# Patient Record
Sex: Male | Born: 1945 | Race: Black or African American | Hispanic: No | Marital: Married | Smoking: Former smoker
Health system: Southern US, Community
[De-identification: ages and names within clinical notes are randomized; demographics above are authoritative.]

## PROBLEM LIST (undated history)

## (undated) DIAGNOSIS — J189 Pneumonia, unspecified organism: Secondary | ICD-10-CM

## (undated) DIAGNOSIS — N179 Acute kidney failure, unspecified: Secondary | ICD-10-CM

## (undated) DIAGNOSIS — M1990 Unspecified osteoarthritis, unspecified site: Secondary | ICD-10-CM

## (undated) DIAGNOSIS — D494 Neoplasm of unspecified behavior of bladder: Secondary | ICD-10-CM

## (undated) DIAGNOSIS — N390 Urinary tract infection, site not specified: Secondary | ICD-10-CM

## (undated) DIAGNOSIS — C801 Malignant (primary) neoplasm, unspecified: Secondary | ICD-10-CM

## (undated) DIAGNOSIS — I739 Peripheral vascular disease, unspecified: Secondary | ICD-10-CM

## (undated) HISTORY — PX: TONSILLECTOMY: SUR1361

## (undated) HISTORY — PX: HERNIA REPAIR: SHX51

## (undated) HISTORY — DX: Malignant (primary) neoplasm, unspecified: C80.1

## (undated) HISTORY — PX: COLON SURGERY: SHX602

---

## 1998-03-26 ENCOUNTER — Ambulatory Visit (HOSPITAL_COMMUNITY): Admission: RE | Admit: 1998-03-26 | Discharge: 1998-03-26 | Payer: Self-pay | Admitting: Internal Medicine

## 2003-01-28 ENCOUNTER — Ambulatory Visit (HOSPITAL_COMMUNITY): Admission: RE | Admit: 2003-01-28 | Discharge: 2003-01-28 | Payer: Self-pay | Admitting: Gastroenterology

## 2005-11-22 ENCOUNTER — Encounter: Payer: Self-pay | Admitting: Internal Medicine

## 2016-02-10 ENCOUNTER — Ambulatory Visit
Admission: RE | Admit: 2016-02-10 | Discharge: 2016-02-10 | Disposition: A | Discharge status: Home / Self Care | Payer: Medicare Other | Source: Ambulatory Visit | Attending: Internal Medicine | Admitting: Internal Medicine

## 2016-02-10 ENCOUNTER — Other Ambulatory Visit: Payer: Self-pay | Admitting: Internal Medicine

## 2016-02-10 DIAGNOSIS — R0602 Shortness of breath: Secondary | ICD-10-CM

## 2017-07-25 ENCOUNTER — Other Ambulatory Visit: Payer: Self-pay | Admitting: Internal Medicine

## 2017-07-25 DIAGNOSIS — Z136 Encounter for screening for cardiovascular disorders: Secondary | ICD-10-CM

## 2017-07-27 ENCOUNTER — Encounter: Payer: Self-pay | Admitting: Vascular Surgery

## 2017-07-27 ENCOUNTER — Ambulatory Visit (INDEPENDENT_AMBULATORY_CARE_PROVIDER_SITE_OTHER): Payer: Medicare Other | Admitting: Vascular Surgery

## 2017-07-27 VITALS — BP 143/75 | HR 73 | Temp 97.7°F | Resp 16 | Ht 69.5 in | Wt 261.0 lb

## 2017-07-27 DIAGNOSIS — I713 Abdominal aortic aneurysm, ruptured, unspecified: Secondary | ICD-10-CM

## 2017-07-27 DIAGNOSIS — I714 Abdominal aortic aneurysm, without rupture, unspecified: Secondary | ICD-10-CM | POA: Insufficient documentation

## 2017-07-27 DIAGNOSIS — I723 Aneurysm of iliac artery: Secondary | ICD-10-CM | POA: Diagnosis not present

## 2017-07-27 NOTE — Addendum Note (Signed)
Addended by: Lianne Cure A on: 07/27/2017 01:13 PM   Modules accepted: Orders

## 2017-07-27 NOTE — Progress Notes (Signed)
Requested by: Dr. Rosie Fate  Reason for consultation: AAA   History of Present Illness   The patient is a 72 y.o. (1946/04/16) male who presents with chief complaint: blood in urine.  Patient had CT scan which demonstrated a anterior bladder tumor and AAA 5.4 cm x 5.0 cm.  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  The patient does not have back or abdominal pain.  The patient notes no history of embolic episodes from the AAA.  The patient's risk factors for AAA included: age, male sex.  The patient does smoke cigarettes.  Past Medical History Colon Mass Bladder Mass  Past Surgical History: colon surgery  Social History   Socioeconomic History  . Marital status: Married    Spouse name: Not on file  . Number of children: Not on file  . Years of education: Not on file  . Highest education level: Not on file  Social Needs  . Financial resource strain: Not on file  . Food insecurity - worry: Not on file  . Food insecurity - inability: Not on file  . Transportation needs - medical: Not on file  . Transportation needs - non-medical: Not on file  Occupational History  . Not on file  Tobacco Use  . Smoking status: Current Every Day Smoker  . Smokeless tobacco: Never Used  . Tobacco comment: 11-15 cigarettes per day  Substance and Sexual Activity  . Alcohol use: Not on file  . Drug use: Not on file  . Sexual activity: Not on file  Other Topics Concern  . Not on file  Social History Narrative  . Not on file   Family History: prostate cancer  Current Outpatient Medications  Medication Sig Dispense Refill  . naproxen sodium (ALEVE) 220 MG tablet Take 220 mg by mouth.     No current facility-administered medications for this visit.      No Known Allergies  REVIEW OF SYSTEMS (negative unless checked):   Cardiac:  []  Chest pain or chest pressure? []  Shortness of breath upon activity? []  Shortness of breath when lying  flat? []  Irregular heart rhythm?  Vascular:  []  Pain in calf, thigh, or hip brought on by walking? []  Pain in feet at night that wakes you up from your sleep? []  Blood clot in your veins? []  Leg swelling?  Pulmonary:  []  Oxygen at home? []  Productive cough? []  Wheezing?  Neurologic:  []  Sudden weakness in arms or legs? []  Sudden numbness in arms or legs? []  Sudden onset of difficult speaking or slurred speech? []  Temporary loss of vision in one eye? []  Problems with dizziness?  Gastrointestinal:  [x]  Blood in stool? []  Vomited blood?  Genitourinary:  []  Burning when urinating? []  Blood in urine?  Psychiatric:  []  Major depression  Hematologic:  []  Bleeding problems? []  Problems with blood clotting?  Dermatologic:  []  Rashes or ulcers?  Constitutional:  []  Fever or chills?  Ear/Nose/Throat:  []  Change in hearing? []  Nose bleeds? []  Sore throat?  Musculoskeletal:  []  Back pain? []  Joint pain? []  Muscle pain?   For VQI Use Only   PRE-ADM LIVING Home  AMB STATUS Ambulatory  CAD Sx None  PRIOR CHF None  STRESS TEST No   Physical Examination     Vitals:   07/27/17 1005  BP: (!) 143/75  Pulse: 73  Resp: 16  Temp: 97.7 F (36.5 C)  TempSrc: Oral  SpO2: 96%  Weight: 261 lb (118.4 kg)  Height: 5' 9.5" (1.765 m)   Body mass index is 37.99 kg/m.  General Alert, O x 3, WD, NAD  Head [place]/AT,    Ear/Nose/ Throat Hearing grossly intact, nares without erythema or drainage, oropharynx without Erythema or Exudate, Mallampati score: 3,   Eyes PERRLA, EOMI,    Neck Supple, mid-line trachea,    Pulmonary Sym exp, good B air movt, CTA B  Cardiac RRR, Nl S1, S2, no Murmurs, No rubs, No S3,S4  Vascular Vessel Right Left  Radial Palpable Palpable  Brachial Palpable Palpable  Carotid Palpable, No Bruit Palpable, No Bruit  Aorta Not palpable N/A  Femoral Palpable Palpable  Popliteal Not palpable Not palpable  PT Not palpable Not palpable  DP Faintly  palpable Faintly palpable    Gastro- intestinal soft, non-distended, non-tender to palpation, No guarding or rebound, no HSM, no masses, no CVAT B, No palpable prominent aortic pulse,    Musculo- skeletal M/S 5/5 throughout  , Extremities without ischemic changes  , No edema present, No visible varicosities , No Lipodermatosclerosis present  Neurologic Cranial nerves 2-12 intact , Pain and light touch intact in extremities , Motor exam as listed above  Psychiatric Judgement intact, Mood & affect appropriate for pt's clinical situation  Dermatologic See M/S exam for extremity exam, No rashes otherwise noted  Lymphatic  Palpable lymph nodes: None    Radiology     CT abd.pelvis w/ and w/o contrast (07/12/17):   1.9 cm x 2.8 cm enhancing lesion in anterior bladder  Incidental 5.4 cm x 5.0 cm abdominal aortic aneurysm  R CIA aneurysm, 4.4 cm  L CIA aneurysm, 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   Medical Decision Making   Jae Skeet is a 72 y.o. 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  Pt will need CTA abd/pelvis to determine whether EVAR +/- iliac branch device placement is possible.  Ideally would like to avoid open repair given history of prior colon surgery and the anticipated need for bladder surgery.  Will get the CTA this coming week and contact the pt with findings.  If EVAR is possible, will arrange for such otherwise will get preop Cardiac clearance and discuss with Urology whether a staged or combined approach to addressing both issues.  Thank you for allowing [place] to participate in this patient's care.   Adele Barthel, MD, FACS [facility] Office: [phone] Pager: [phone]  07/27/2017, 10:25 AM

## 2017-07-31 ENCOUNTER — Ambulatory Visit: Payer: Medicare Other

## 2017-08-20 NOTE — Progress Notes (Signed)
Established Abdominal Aortic Aneurysm   History of Present Illness   The patient is a 72 y.o. (12/24/1945) male with bladder cancer who presents with chief complaint: follow up for AAA.  Previous studies demonstrate an AAA, measuring 5.4 cm x 5.0 cm.  Additionally, R CIA 4.4 cm and L CIA 5.8 cm.  The patient does not have back or abdominal pain.  The patient is an active smoker.   Past Medical History Colon Mass Bladder Mass  Past Surgical History: colon surgery   Social History   Socioeconomic History  . Marital status: Married    Spouse name: Not on file  . Number of children: Not on file  . Years of education: Not on file  . Highest education level: Not on file  Social Needs  . Financial resource strain: Not on file  . Food insecurity - worry: Not on file  . Food insecurity - inability: Not on file  . Transportation needs - medical: Not on file  . Transportation needs - non-medical: Not on file  Occupational History  . Not on file  Tobacco Use  . Smoking status: Current Every Day Smoker  . Smokeless tobacco: Never Used  . Tobacco comment: 11-15 cigarettes per day  Substance and Sexual Activity  . Alcohol use: Not on file  . Drug use: Not on file  . Sexual activity: Not on file  Other Topics Concern  . Not on file  Social History Narrative  . Not on file    Family History: patient is unable to detail the medical history of his parents   Current Outpatient Medications  Medication Sig Dispense Refill  . naproxen sodium (ALEVE) 220 MG tablet Take 220 mg by mouth.     No current facility-administered medications for this visit.      No Known Allergies  REVIEW OF SYSTEMS (negative unless checked):   Cardiac:  []  Chest pain or chest pressure? []  Shortness of breath upon activity? []  Shortness of breath when lying flat? []  Irregular heart rhythm?  Vascular:  []  Pain in calf, thigh, or hip brought on by walking? []  Pain in feet at night that wakes  you up from your sleep? []  Blood clot in your veins? []  Leg swelling?  Pulmonary:  []  Oxygen at home? []  Productive cough? []  Wheezing?  Neurologic:  []  Sudden weakness in arms or legs? []  Sudden numbness in arms or legs? []  Sudden onset of difficult speaking or slurred speech? []  Temporary loss of vision in one eye? []  Problems with dizziness?  Gastrointestinal:  []  Blood in stool? []  Vomited blood?  Genitourinary:  []  Burning when urinating? [x]  Blood in urine?  Psychiatric:  []  Major depression  Hematologic:  []  Bleeding problems? []  Problems with blood clotting?  Dermatologic:  []  Rashes or ulcers?  Constitutional:  []  Fever or chills?  Ear/Nose/Throat:  []  Change in hearing? []  Nose bleeds? []  Sore throat?  Musculoskeletal:  []  Back pain? []  Joint pain? []  Muscle pain?   Physical Examination   Vitals:   08/24/17 0921  BP: (!) 141/90  Pulse: 60  Resp: 16  Temp: 97.6 F (36.4 C)  TempSrc: Oral  SpO2: 96%  Weight: 258 lb (117 kg)  Height: 5' 9.5" (1.765 m)   Body mass index is 37.55 kg/m.  General Alert, O x 3, WD, NAD  Head [place]/AT,    Ear/Nose/ Throat Hearing grossly intact, nares without erythema or drainage, oropharynx without Erythema or Exudate, Mallampati score: 3,  Eyes PERRLA, EOMI,    Neck Supple, mid-line trachea,    Pulmonary Sym exp, good B air movt, CTA B  Cardiac RRR, Nl S1, S2, no Murmurs, No rubs, No S3,S4  Vascular Vessel Right Left  Radial Palpable Palpable  Brachial Palpable Palpable  Carotid Palpable, No Bruit Palpable, No Bruit  Aorta Not palpable N/A  Femoral Palpable Palpable  Popliteal Not palpable Not palpable  PT Not palpable Not palpable  DP Faintly palpable Faintly palpable    Gastro- intestinal soft, non-distended, non-tender to palpation, No guarding or rebound, no HSM, no masses, no CVAT B, No palpable prominent aortic pulse,    Musculo- skeletal M/S 5/5 throughout  , Extremities without ischemic  changes  , No edema present, No visible varicosities , No Lipodermatosclerosis present  Neurologic Cranial nerves 2-12 intact , Pain and light touch intact in extremities , Motor exam as listed above  Psychiatric Judgement intact, Mood & affect appropriate for pt's clinical situation  Dermatologic See M/S exam for extremity exam, No rashes otherwise noted  Lymphatic  Palpable lymph nodes: None    Radiology     Ct Angio Abdomen Pelvis  W &/or Wo Contrast  Result Date: 08/24/2017 CLINICAL DATA:  Abdominal aortic aneurysm EXAM: CTA ABDOMEN AND PELVIS wITHOUT AND WITH CONTRAST TECHNIQUE: Multidetector CT imaging of the abdomen and pelvis was performed using the standard protocol during bolus administration of intravenous contrast. Multiplanar reconstructed images and MIPs were obtained and reviewed to evaluate the vascular anatomy. CONTRAST:  158mL ISOVUE-370 IOPAMIDOL (ISOVUE-370) INJECTION 76% Creatinine was obtained on site at [facility] at [address] Results: Creatinine 1.0 mg/dL. COMPARISON:  07/12/2017 FINDINGS: VASCULAR Aorta: An infrarenal abdominal aortic aneurysm is present. Maximal AP and transverse diameters are 5.3 and 4.9 cm compared with 5.4 and 5.0 cm on the prior study. Atherosclerotic calcifications and smooth mural soft plaque is present in the aneurysmal segment. There is no significant angulation at the neck. Suprarenal aorta is nonaneurysmal. Celiac: Mild atherosclerotic calcification and focal web-like soft plaque at the origin. This results in some degree of narrowing. Branch vessels patent. SMA: There is 60-70% narrowing at the origin of the SMA secondary to calcified and soft plaque. Branch vessels are grossly patent. Renals: There are atherosclerotic calcifications and some plaque at the origin of the single left renal artery without significant narrowing. There is extensive calcified plaque at the origin of the right renal artery and significant narrowing is  suspected. This is corroborated by diminished density perfusion of the right kidney with respect to that of the left. 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 The lumen is patent. Internal and external iliac arteries are patent. 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 The lumen is patent. Internal artery origin narrowing is present with post stenotic dilatation measuring 1.6 cm in caliber. Left external iliac artery is patent. Proximal Outflow: Grossly patent with atherosclerotic calcifications. Veins: There is no obvious DVT. Review of the MIP images confirms the above findings. NON-VASCULAR Lower chest: 5 mm nodule at the lateral base of the right middle lobe on image 11 is grossly stable 7 mm left lower lobe pulmonary nodule on image 7 is stable. 5 mm left lower lobe pulmonary nodule on image 11. This is stable. Hepatobiliary: Gallbladder and liver are  within normal limits. Pancreas: Unremarkable Spleen: Unremarkable Adrenals/Urinary Tract: There are several cysts in the left kidney. No evidence of renal mass or hydronephrosis. Diminished perfusion of the right kidney on arterial phase imaging indicates arterial insufficiency. Adrenal glands are within normal limits. Anterior bladder mass is redemonstrated. It measures up to 3.4 cm. Stomach/Bowel: The stomach is decompressed. Duodenum is unremarkable. There is no evidence of small-bowel obstruction. Moderate stool burden throughout the colon. No obvious mass in the colon. Lymphatic: No abnormal retroperitoneal adenopathy. Reproductive: Prostate is diminutive. Other: No free fluid. Musculoskeletal: No vertebral compression deformity. Severe disc space narrowing at L3-4, L4-5, and L5-S1. IMPRESSION: VASCULAR Abdominal aortic aneurysm is not significantly changed  with a maximal diameter of 5.3 cm compare with 5.4 cm on the prior study. Recommend followup by abdomen and pelvis CTA in 3-6 months, and vascular surgery referral/consultation if not already obtained. This recommendation follows ACR consensus guidelines: White Paper of the ACR Incidental Findings Committee II on Vascular Findings. J Am Coll Radiol 2013; 10:789-794. 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 Significant narrowing at the origin of the SMA. There is some degree of narrowing at the origin of the celiac. Significant narrowing at the origin of the right renal artery. This is corroborated by diminished arterial enhancement of the right renal parenchyma with respect to that of the left. NON-VASCULAR There are several small pulmonary nodules which are not significantly changed compared with the recent study. Given the bladder mass, metastatic disease is not excluded. 3.4 cm bladder mass.  Malignancy is not excluded. Electronically Signed   By: Marybelle Killings M.D.   On: 08/24/2017 08:31   Based on my review of this patient's CTA, his anatomy appears compatible with EVAR, R IBE, likely L IIA embolization limb extension into EIA though might be able to extending further into L IIA with IBE   Medical Decision Making   Alik Mawson is a 72 y.o. 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  With large iliac artery aneurysms, I would recommend proceeding with repair within the next 2-4 weeks.  Based on this patient's exam and diagnostic studies, he needs EVAR, possible B IBE placement vs IIA embolization.  I would prefer to stage the  IIA embolization if needed to avoid pelvic ischemia as much as possible.  This sometimes requires spacing IIA embolization over 2-4 weeks to allow pelvic collateralization.  If IBE placement will be possible, we may be able to avoid this delay.  I will have my [employer] rep review the CTA for the IBE placement. The patient is aware the risks of endovascular aortic surgery include but are not limited to: bleeding, need for transfusion, infection, death, stroke, paralysis, wound complications, spinal, pelvic and bowel ischemia, extended ventilation, anaphylactic reaction to contrast, contrast induced nephropathy, embolism, and need for additional procedure to address endoleaks.   Overall, I cited a mortality rate of 1-2% and morbidity rate of 15%.  The patient has agreed to proceed.  He is scheduled for 18 FEB 19.  Thank you for allowing [place] to participate in this patient's care.   Adele Barthel, MD, FACS [facility] Office: [phone] Pager: [phone]

## 2017-08-20 NOTE — H&P (View-Only) (Signed)
Established Abdominal Aortic Aneurysm   History of Present Illness   The patient is a 72 y.o. (1946/06/14) male with bladder cancer who presents with chief complaint: follow up for AAA.  Previous studies demonstrate an AAA, measuring 5.4 cm x 5.0 cm.  Additionally, R CIA 4.4 cm and L CIA 5.8 cm.  The patient does not have back or abdominal pain.  The patient is an active smoker.   Past Medical History Colon Mass Bladder Mass  Past Surgical History: colon surgery   Social History   Socioeconomic History  . Marital status: Married    Spouse name: Not on file  . Number of children: Not on file  . Years of education: Not on file  . Highest education level: Not on file  Social Needs  . Financial resource strain: Not on file  . Food insecurity - worry: Not on file  . Food insecurity - inability: Not on file  . Transportation needs - medical: Not on file  . Transportation needs - non-medical: Not on file  Occupational History  . Not on file  Tobacco Use  . Smoking status: Current Every Day Smoker  . Smokeless tobacco: Never Used  . Tobacco comment: 11-15 cigarettes per day  Substance and Sexual Activity  . Alcohol use: Not on file  . Drug use: Not on file  . Sexual activity: Not on file  Other Topics Concern  . Not on file  Social History Narrative  . Not on file    Family History: patient is unable to detail the medical history of his parents   Current Outpatient Medications  Medication Sig Dispense Refill  . naproxen sodium (ALEVE) 220 MG tablet Take 220 mg by mouth.     No current facility-administered medications for this visit.      No Known Allergies  REVIEW OF SYSTEMS (negative unless checked):   Cardiac:  []  Chest pain or chest pressure? []  Shortness of breath upon activity? []  Shortness of breath when lying flat? []  Irregular heart rhythm?  Vascular:  []  Pain in calf, thigh, or hip brought on by walking? []  Pain in feet at night that wakes  you up from your sleep? []  Blood clot in your veins? []  Leg swelling?  Pulmonary:  []  Oxygen at home? []  Productive cough? []  Wheezing?  Neurologic:  []  Sudden weakness in arms or legs? []  Sudden numbness in arms or legs? []  Sudden onset of difficult speaking or slurred speech? []  Temporary loss of vision in one eye? []  Problems with dizziness?  Gastrointestinal:  []  Blood in stool? []  Vomited blood?  Genitourinary:  []  Burning when urinating? [x]  Blood in urine?  Psychiatric:  []  Major depression  Hematologic:  []  Bleeding problems? []  Problems with blood clotting?  Dermatologic:  []  Rashes or ulcers?  Constitutional:  []  Fever or chills?  Ear/Nose/Throat:  []  Change in hearing? []  Nose bleeds? []  Sore throat?  Musculoskeletal:  []  Back pain? []  Joint pain? []  Muscle pain?   Physical Examination   Vitals:   08/24/17 0921  BP: (!) 141/90  Pulse: 60  Resp: 16  Temp: 97.6 F (36.4 C)  TempSrc: Oral  SpO2: 96%  Weight: 258 lb (117 kg)  Height: 5' 9.5" (1.765 m)   Body mass index is 37.55 kg/m.  General Alert, O x 3, WD, NAD  Head [place]/AT,    Ear/Nose/ Throat Hearing grossly intact, nares without erythema or drainage, oropharynx without Erythema or Exudate, Mallampati score: 3,  Eyes PERRLA, EOMI,    Neck Supple, mid-line trachea,    Pulmonary Sym exp, good B air movt, CTA B  Cardiac RRR, Nl S1, S2, no Murmurs, No rubs, No S3,S4  Vascular Vessel Right Left  Radial Palpable Palpable  Brachial Palpable Palpable  Carotid Palpable, No Bruit Palpable, No Bruit  Aorta Not palpable N/A  Femoral Palpable Palpable  Popliteal Not palpable Not palpable  PT Not palpable Not palpable  DP Faintly palpable Faintly palpable    Gastro- intestinal soft, non-distended, non-tender to palpation, No guarding or rebound, no HSM, no masses, no CVAT B, No palpable prominent aortic pulse,    Musculo- skeletal M/S 5/5 throughout  , Extremities without ischemic  changes  , No edema present, No visible varicosities , No Lipodermatosclerosis present  Neurologic Cranial nerves 2-12 intact , Pain and light touch intact in extremities , Motor exam as listed above  Psychiatric Judgement intact, Mood & affect appropriate for pt's clinical situation  Dermatologic See M/S exam for extremity exam, No rashes otherwise noted  Lymphatic  Palpable lymph nodes: None    Radiology     Ct Angio Abdomen Pelvis  W &/or Wo Contrast  Result Date: 08/24/2017 CLINICAL DATA:  Abdominal aortic aneurysm EXAM: CTA ABDOMEN AND PELVIS wITHOUT AND WITH CONTRAST TECHNIQUE: Multidetector CT imaging of the abdomen and pelvis was performed using the standard protocol during bolus administration of intravenous contrast. Multiplanar reconstructed images and MIPs were obtained and reviewed to evaluate the vascular anatomy. CONTRAST:  177mL ISOVUE-370 IOPAMIDOL (ISOVUE-370) INJECTION 76% Creatinine was obtained on site at [facility] at [address] Results: Creatinine 1.0 mg/dL. COMPARISON:  07/12/2017 FINDINGS: VASCULAR Aorta: An infrarenal abdominal aortic aneurysm is present. Maximal AP and transverse diameters are 5.3 and 4.9 cm compared with 5.4 and 5.0 cm on the prior study. Atherosclerotic calcifications and smooth mural soft plaque is present in the aneurysmal segment. There is no significant angulation at the neck. Suprarenal aorta is nonaneurysmal. Celiac: Mild atherosclerotic calcification and focal web-like soft plaque at the origin. This results in some degree of narrowing. Branch vessels patent. SMA: There is 60-70% narrowing at the origin of the SMA secondary to calcified and soft plaque. Branch vessels are grossly patent. Renals: There are atherosclerotic calcifications and some plaque at the origin of the single left renal artery without significant narrowing. There is extensive calcified plaque at the origin of the right renal artery and significant narrowing is  suspected. This is corroborated by diminished density perfusion of the right kidney with respect to that of the left. 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 The lumen is patent. Internal and external iliac arteries are patent. 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 The lumen is patent. Internal artery origin narrowing is present with post stenotic dilatation measuring 1.6 cm in caliber. Left external iliac artery is patent. Proximal Outflow: Grossly patent with atherosclerotic calcifications. Veins: There is no obvious DVT. Review of the MIP images confirms the above findings. NON-VASCULAR Lower chest: 5 mm nodule at the lateral base of the right middle lobe on image 11 is grossly stable 7 mm left lower lobe pulmonary nodule on image 7 is stable. 5 mm left lower lobe pulmonary nodule on image 11. This is stable. Hepatobiliary: Gallbladder and liver are  within normal limits. Pancreas: Unremarkable Spleen: Unremarkable Adrenals/Urinary Tract: There are several cysts in the left kidney. No evidence of renal mass or hydronephrosis. Diminished perfusion of the right kidney on arterial phase imaging indicates arterial insufficiency. Adrenal glands are within normal limits. Anterior bladder mass is redemonstrated. It measures up to 3.4 cm. Stomach/Bowel: The stomach is decompressed. Duodenum is unremarkable. There is no evidence of small-bowel obstruction. Moderate stool burden throughout the colon. No obvious mass in the colon. Lymphatic: No abnormal retroperitoneal adenopathy. Reproductive: Prostate is diminutive. Other: No free fluid. Musculoskeletal: No vertebral compression deformity. Severe disc space narrowing at L3-4, L4-5, and L5-S1. IMPRESSION: VASCULAR Abdominal aortic aneurysm is not significantly changed  with a maximal diameter of 5.3 cm compare with 5.4 cm on the prior study. Recommend followup by abdomen and pelvis CTA in 3-6 months, and vascular surgery referral/consultation if not already obtained. This recommendation follows ACR consensus guidelines: White Paper of the ACR Incidental Findings Committee II on Vascular Findings. J Am Coll Radiol 2013; 10:789-794. 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 Significant narrowing at the origin of the SMA. There is some degree of narrowing at the origin of the celiac. Significant narrowing at the origin of the right renal artery. This is corroborated by diminished arterial enhancement of the right renal parenchyma with respect to that of the left. NON-VASCULAR There are several small pulmonary nodules which are not significantly changed compared with the recent study. Given the bladder mass, metastatic disease is not excluded. 3.4 cm bladder mass.  Malignancy is not excluded. Electronically Signed   By: Marybelle Killings M.D.   On: 08/24/2017 08:31   Based on my review of this patient's CTA, his anatomy appears compatible with EVAR, R IBE, likely L IIA embolization limb extension into EIA though might be able to extending further into L IIA with IBE   Medical Decision Making   Dimitrious Micciche is a 72 y.o. 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  With large iliac artery aneurysms, I would recommend proceeding with repair within the next 2-4 weeks.  Based on this patient's exam and diagnostic studies, he needs EVAR, possible B IBE placement vs IIA embolization.  I would prefer to stage the  IIA embolization if needed to avoid pelvic ischemia as much as possible.  This sometimes requires spacing IIA embolization over 2-4 weeks to allow pelvic collateralization.  If IBE placement will be possible, we may be able to avoid this delay.  I will have my [employer] rep review the CTA for the IBE placement. The patient is aware the risks of endovascular aortic surgery include but are not limited to: bleeding, need for transfusion, infection, death, stroke, paralysis, wound complications, spinal, pelvic and bowel ischemia, extended ventilation, anaphylactic reaction to contrast, contrast induced nephropathy, embolism, and need for additional procedure to address endoleaks.   Overall, I cited a mortality rate of 1-2% and morbidity rate of 15%.  The patient has agreed to proceed.  He is scheduled for 18 FEB 19.  Thank you for allowing [place] to participate in this patient's care.   Adele Barthel, MD, FACS [facility] Office: [phone] Pager: [phone]

## 2017-08-24 ENCOUNTER — Ambulatory Visit
Admission: RE | Admit: 2017-08-24 | Discharge: 2017-08-24 | Disposition: A | Discharge status: Home / Self Care | Payer: Medicare Other | Source: Ambulatory Visit | Attending: Vascular Surgery | Admitting: Vascular Surgery

## 2017-08-24 ENCOUNTER — Encounter: Payer: Self-pay | Admitting: *Deleted

## 2017-08-24 ENCOUNTER — Other Ambulatory Visit: Payer: Self-pay | Admitting: *Deleted

## 2017-08-24 ENCOUNTER — Encounter: Payer: Self-pay | Admitting: Vascular Surgery

## 2017-08-24 ENCOUNTER — Ambulatory Visit (INDEPENDENT_AMBULATORY_CARE_PROVIDER_SITE_OTHER): Payer: Medicare Other | Admitting: Vascular Surgery

## 2017-08-24 VITALS — BP 141/90 | HR 60 | Temp 97.6°F | Resp 16 | Ht 69.5 in | Wt 258.0 lb

## 2017-08-24 DIAGNOSIS — I714 Abdominal aortic aneurysm, without rupture, unspecified: Secondary | ICD-10-CM

## 2017-08-24 DIAGNOSIS — I723 Aneurysm of iliac artery: Secondary | ICD-10-CM

## 2017-08-24 DIAGNOSIS — I713 Abdominal aortic aneurysm, ruptured, unspecified: Secondary | ICD-10-CM

## 2017-08-24 MED ORDER — IOPAMIDOL (ISOVUE-370) INJECTION 76%
100.0000 mL | Freq: Once | INTRAVENOUS | Status: AC | PRN
  Administered 2017-08-24: 100 mL via INTRAVENOUS

## 2017-08-27 ENCOUNTER — Other Ambulatory Visit: Payer: Self-pay | Admitting: Urology

## 2017-08-29 NOTE — Progress Notes (Signed)
Please place orders in epic pt has preop 08-30-17 @0900 . Thank you!

## 2017-08-29 NOTE — Patient Instructions (Addendum)
Nathaniel Kirk  08/29/2017   Your procedure is scheduled on: 08-31-17  Report to [facility] Main  Entrance             Report to admitting at      [phone]   Call this number if you have problems the morning of surgery [phone]    Remember: Do not eat food or drink liquids :After Midnight.     Take these medicines the morning of surgery with A SIP OF WATER: none                                You may not have any metal on your body including hair pins and              piercings  Do not wear jewelry,  lotions, powders or perfumes, deodorant    .              Men may shave face and neck.   Do not bring valuables to the hospital. [place].  Contacts, dentures or bridgework may not be worn into surgery.     Patients discharged the day of surgery will not be allowed to drive home.  Name and phone number of your driver:  Special Instructions: N/A              Please read over the following fact sheets you were given: _____________________________________________________________________           [facility] - Preparing for Surgery Before surgery, you can play an important role.  Because skin is not sterile, your skin needs to be as free of germs as possible.  You can reduce the number of germs on your skin by washing with CHG (chlorahexidine gluconate) soap before surgery.  CHG is an antiseptic cleaner which kills germs and bonds with the skin to continue killing germs even after washing. Please DO NOT use if you have an allergy to CHG or antibacterial soaps.  If your skin becomes reddened/irritated stop using the CHG and inform your nurse when you arrive at Short Stay. Do not shave (including legs and underarms) for at least 48 hours prior to the first CHG shower.  You may shave your face/neck. Please follow these instructions carefully:  1.  Shower with CHG Soap the night before surgery and the  morning of  Surgery.  2.  If you choose to wash your hair, wash your hair first as usual with your  normal  shampoo.  3.  After you shampoo, rinse your hair and body thoroughly to remove the  shampoo.                           4.  Use CHG as you would any other liquid soap.  You can apply chg directly  to the skin and wash                       Gently with a scrungie or clean washcloth.  5.  Apply the CHG Soap to your body ONLY FROM THE NECK DOWN.   Do not use on face/ open  Wound or open sores. Avoid contact with eyes, ears mouth and genitals (private parts).                       Wash face,  Genitals (private parts) with your normal soap.             6.  Wash thoroughly, paying special attention to the area where your surgery  will be performed.  7.  Thoroughly rinse your body with warm water from the neck down.  8.  DO NOT shower/wash with your normal soap after using and rinsing off  the CHG Soap.                9.  Pat yourself dry with a clean towel.            10.  Wear clean pajamas.            11.  Place clean sheets on your bed the night of your first shower and do not  sleep with pets. Day of Surgery : Do not apply any lotions/deodorants the morning of surgery.  Please wear clean clothes to the hospital/surgery center.  FAILURE TO FOLLOW THESE INSTRUCTIONS MAY RESULT IN THE CANCELLATION OF YOUR SURGERY PATIENT SIGNATURE_________________________________  NURSE SIGNATURE__________________________________  ________________________________________________________________________

## 2017-08-30 ENCOUNTER — Encounter (HOSPITAL_COMMUNITY): Payer: Self-pay

## 2017-08-30 ENCOUNTER — Other Ambulatory Visit: Payer: Self-pay

## 2017-08-30 ENCOUNTER — Encounter (HOSPITAL_COMMUNITY)
Admission: RE | Admit: 2017-08-30 | Discharge: 2017-08-30 | Disposition: A | Discharge status: Home / Self Care | Payer: Medicare Other | Source: Ambulatory Visit | Attending: Urology | Admitting: Urology

## 2017-08-30 HISTORY — DX: Peripheral vascular disease, unspecified: I73.9

## 2017-08-30 HISTORY — DX: Unspecified osteoarthritis, unspecified site: M19.90

## 2017-08-30 HISTORY — DX: Pneumonia, unspecified organism: J18.9

## 2017-08-30 LAB — CBC
HCT: 45.9 % (ref 39.0–52.0)
Hemoglobin: 15.7 g/dL (ref 13.0–17.0)
MCH: 31.8 pg (ref 26.0–34.0)
MCHC: 34.2 g/dL (ref 30.0–36.0)
MCV: 92.9 fL (ref 78.0–100.0)
PLATELETS: 189 10*3/uL (ref 150–400)
RBC: 4.94 MIL/uL (ref 4.22–5.81)
RDW: 13.8 % (ref 11.5–15.5)
WBC: 9 10*3/uL (ref 4.0–10.5)

## 2017-08-30 LAB — BASIC METABOLIC PANEL
Anion gap: 5 (ref 5–15)
BUN: 18 mg/dL (ref 6–20)
CO2: 26 mmol/L (ref 22–32)
CREATININE: 0.96 mg/dL (ref 0.61–1.24)
Calcium: 9.3 mg/dL (ref 8.9–10.3)
Chloride: 106 mmol/L (ref 101–111)
Glucose, Bld: 101 mg/dL — ABNORMAL HIGH (ref 65–99)
POTASSIUM: 4.5 mmol/L (ref 3.5–5.1)
SODIUM: 137 mmol/L (ref 135–145)

## 2017-08-30 NOTE — Progress Notes (Signed)
Pt. Having  TURBT  08-31-17 . Dr. Lissa Hoard anesthesia aware of pt. Planned surgery for aneurysms 09-10-17.

## 2017-08-30 NOTE — Progress Notes (Signed)
Clearance 07-30-17 Dr. Bridgett Larsson vascular on chart.  lov 08-24-17 Dr. Bridgett Larsson vascular epic

## 2017-08-31 ENCOUNTER — Inpatient Hospital Stay (HOSPITAL_COMMUNITY)
Admission: RE | Admit: 2017-08-31 | Discharge: 2017-09-04 | DRG: 670 | Disposition: A | Discharge status: Home / Self Care | Payer: Medicare Other | Source: Ambulatory Visit | Attending: Urology | Admitting: Urology

## 2017-08-31 ENCOUNTER — Encounter (HOSPITAL_COMMUNITY): Payer: Self-pay | Admitting: *Deleted

## 2017-08-31 ENCOUNTER — Ambulatory Visit (HOSPITAL_COMMUNITY): Payer: Medicare Other | Admitting: Anesthesiology

## 2017-08-31 ENCOUNTER — Ambulatory Visit (HOSPITAL_COMMUNITY): Payer: Medicare Other

## 2017-08-31 ENCOUNTER — Encounter (HOSPITAL_COMMUNITY)
Admission: RE | Disposition: A | Discharge status: Home / Self Care | Payer: Self-pay | Source: Ambulatory Visit | Attending: Urology

## 2017-08-31 DIAGNOSIS — I723 Aneurysm of iliac artery: Secondary | ICD-10-CM | POA: Diagnosis present

## 2017-08-31 DIAGNOSIS — F17200 Nicotine dependence, unspecified, uncomplicated: Secondary | ICD-10-CM | POA: Diagnosis present

## 2017-08-31 DIAGNOSIS — R1030 Lower abdominal pain, unspecified: Secondary | ICD-10-CM | POA: Diagnosis not present

## 2017-08-31 DIAGNOSIS — I714 Abdominal aortic aneurysm, without rupture: Secondary | ICD-10-CM | POA: Diagnosis present

## 2017-08-31 DIAGNOSIS — M170 Bilateral primary osteoarthritis of knee: Secondary | ICD-10-CM | POA: Diagnosis present

## 2017-08-31 DIAGNOSIS — E669 Obesity, unspecified: Secondary | ICD-10-CM | POA: Diagnosis present

## 2017-08-31 DIAGNOSIS — Z6838 Body mass index (BMI) 38.0-38.9, adult: Secondary | ICD-10-CM

## 2017-08-31 DIAGNOSIS — D494 Neoplasm of unspecified behavior of bladder: Secondary | ICD-10-CM | POA: Diagnosis present

## 2017-08-31 DIAGNOSIS — Z8701 Personal history of pneumonia (recurrent): Secondary | ICD-10-CM

## 2017-08-31 HISTORY — PX: TRANSURETHRAL RESECTION OF BLADDER TUMOR: SHX2575

## 2017-08-31 HISTORY — PX: CYSTOSCOPY W/ RETROGRADES: SHX1426

## 2017-08-31 LAB — CBC
HCT: 45.5 % (ref 39.0–52.0)
Hemoglobin: 15.3 g/dL (ref 13.0–17.0)
MCH: 31.6 pg (ref 26.0–34.0)
MCHC: 33.6 g/dL (ref 30.0–36.0)
MCV: 94 fL (ref 78.0–100.0)
PLATELETS: 178 10*3/uL (ref 150–400)
RBC: 4.84 MIL/uL (ref 4.22–5.81)
RDW: 13.8 % (ref 11.5–15.5)
WBC: 11.6 10*3/uL — AB (ref 4.0–10.5)

## 2017-08-31 LAB — BASIC METABOLIC PANEL
ANION GAP: 10 (ref 5–15)
BUN: 19 mg/dL (ref 6–20)
CALCIUM: 8.5 mg/dL — AB (ref 8.9–10.3)
CO2: 22 mmol/L (ref 22–32)
Chloride: 102 mmol/L (ref 101–111)
Creatinine, Ser: 0.93 mg/dL (ref 0.61–1.24)
Glucose, Bld: 161 mg/dL — ABNORMAL HIGH (ref 65–99)
Potassium: 4.3 mmol/L (ref 3.5–5.1)
Sodium: 134 mmol/L — ABNORMAL LOW (ref 135–145)

## 2017-08-31 SURGERY — TURBT (TRANSURETHRAL RESECTION OF BLADDER TUMOR)
Anesthesia: General

## 2017-08-31 MED ORDER — LIDOCAINE 2% (20 MG/ML) 5 ML SYRINGE
INTRAMUSCULAR | Status: AC
  Filled 2017-08-31: qty 5

## 2017-08-31 MED ORDER — PHENYLEPHRINE 40 MCG/ML (10ML) SYRINGE FOR IV PUSH (FOR BLOOD PRESSURE SUPPORT)
PREFILLED_SYRINGE | INTRAVENOUS | Status: DC | PRN
  Administered 2017-08-31: 80 ug via INTRAVENOUS

## 2017-08-31 MED ORDER — PHENYLEPHRINE 40 MCG/ML (10ML) SYRINGE FOR IV PUSH (FOR BLOOD PRESSURE SUPPORT)
PREFILLED_SYRINGE | INTRAVENOUS | Status: AC
  Filled 2017-08-31: qty 10

## 2017-08-31 MED ORDER — LACTATED RINGERS IV SOLN
INTRAVENOUS | Status: DC
  Administered 2017-08-31: 10:00:00 via INTRAVENOUS
  Administered 2017-08-31: 1000 mL via INTRAVENOUS
  Administered 2017-08-31 (×2): via INTRAVENOUS

## 2017-08-31 MED ORDER — FENTANYL CITRATE (PF) 100 MCG/2ML IJ SOLN
INTRAMUSCULAR | Status: DC | PRN
  Administered 2017-08-31: 100 ug via INTRAVENOUS

## 2017-08-31 MED ORDER — HYDROMORPHONE HCL 1 MG/ML IJ SOLN
0.5000 mg | INTRAMUSCULAR | Status: DC | PRN
  Administered 2017-08-31: 1 mg via INTRAVENOUS

## 2017-08-31 MED ORDER — ACETAMINOPHEN 325 MG PO TABS: 650.0000 mg | ORAL_TABLET | ORAL | Status: DC | PRN

## 2017-08-31 MED ORDER — LIDOCAINE 2% (20 MG/ML) 5 ML SYRINGE
INTRAMUSCULAR | Status: DC | PRN
  Administered 2017-08-31: 50 mg via INTRAVENOUS

## 2017-08-31 MED ORDER — ONDANSETRON HCL 4 MG/2ML IJ SOLN: 4.0000 mg | Freq: Once | INTRAMUSCULAR | Status: DC | PRN

## 2017-08-31 MED ORDER — DEXAMETHASONE SODIUM PHOSPHATE 10 MG/ML IJ SOLN
INTRAMUSCULAR | Status: AC
  Filled 2017-08-31: qty 1

## 2017-08-31 MED ORDER — ONDANSETRON HCL 4 MG/2ML IJ SOLN
INTRAMUSCULAR | Status: DC | PRN
  Administered 2017-08-31: 4 mg via INTRAVENOUS

## 2017-08-31 MED ORDER — ONDANSETRON HCL 4 MG/2ML IJ SOLN
INTRAMUSCULAR | Status: AC
  Filled 2017-08-31: qty 2

## 2017-08-31 MED ORDER — FENTANYL CITRATE (PF) 100 MCG/2ML IJ SOLN
INTRAMUSCULAR | Status: AC
  Filled 2017-08-31: qty 2

## 2017-08-31 MED ORDER — CEFAZOLIN SODIUM-DEXTROSE 2-4 GM/100ML-% IV SOLN
2.0000 g | INTRAVENOUS | Status: AC
  Administered 2017-08-31: 2 g via INTRAVENOUS
  Filled 2017-08-31: qty 100

## 2017-08-31 MED ORDER — OXYCODONE HCL 5 MG PO TABS: 5.0000 mg | ORAL_TABLET | ORAL | 0 refills | Status: DC | PRN

## 2017-08-31 MED ORDER — HYDROMORPHONE HCL 1 MG/ML IJ SOLN
INTRAMUSCULAR | Status: AC
  Filled 2017-08-31: qty 1

## 2017-08-31 MED ORDER — ZOLPIDEM TARTRATE 5 MG PO TABS: 5.0000 mg | ORAL_TABLET | Freq: Every evening | ORAL | Status: DC | PRN

## 2017-08-31 MED ORDER — DIPHENHYDRAMINE HCL 12.5 MG/5ML PO ELIX: 12.5000 mg | ORAL_SOLUTION | Freq: Four times a day (QID) | ORAL | Status: DC | PRN

## 2017-08-31 MED ORDER — IOHEXOL 300 MG/ML  SOLN
INTRAMUSCULAR | Status: DC | PRN
  Administered 2017-08-31: 9 mL via INTRAVENOUS

## 2017-08-31 MED ORDER — PROPOFOL 10 MG/ML IV BOLUS
INTRAVENOUS | Status: DC | PRN
  Administered 2017-08-31: 170 mg via INTRAVENOUS

## 2017-08-31 MED ORDER — SODIUM CHLORIDE 0.9 % IR SOLN
Status: DC | PRN
  Administered 2017-08-31 (×2): 3000 mL

## 2017-08-31 MED ORDER — OXYCODONE HCL 5 MG/5ML PO SOLN
5.0000 mg | Freq: Once | ORAL | Status: DC | PRN
  Filled 2017-08-31: qty 5

## 2017-08-31 MED ORDER — ROCURONIUM BROMIDE 10 MG/ML (PF) SYRINGE
PREFILLED_SYRINGE | INTRAVENOUS | Status: DC | PRN
  Administered 2017-08-31: 40 mg via INTRAVENOUS

## 2017-08-31 MED ORDER — 0.9 % SODIUM CHLORIDE (POUR BTL) OPTIME
TOPICAL | Status: DC | PRN
  Administered 2017-08-31: 1000 mL

## 2017-08-31 MED ORDER — SODIUM CHLORIDE 0.9 % IV SOLN
INTRAVENOUS | Status: DC
  Administered 2017-08-31 – 2017-09-01 (×2): via INTRAVENOUS

## 2017-08-31 MED ORDER — DIPHENHYDRAMINE HCL 50 MG/ML IJ SOLN: 12.5000 mg | Freq: Four times a day (QID) | INTRAMUSCULAR | Status: DC | PRN

## 2017-08-31 MED ORDER — DEXAMETHASONE SODIUM PHOSPHATE 10 MG/ML IJ SOLN
INTRAMUSCULAR | Status: DC | PRN
  Administered 2017-08-31: 10 mg via INTRAVENOUS

## 2017-08-31 MED ORDER — FENTANYL CITRATE (PF) 100 MCG/2ML IJ SOLN
25.0000 ug | INTRAMUSCULAR | Status: DC | PRN
  Administered 2017-08-31 (×2): 50 ug via INTRAVENOUS

## 2017-08-31 MED ORDER — OXYCODONE-ACETAMINOPHEN 5-325 MG PO TABS
1.0000 | ORAL_TABLET | ORAL | Status: DC | PRN
  Administered 2017-08-31 – 2017-09-03 (×7): 2 via ORAL
  Administered 2017-09-03: 1 via ORAL
  Administered 2017-09-03: 2 via ORAL
  Filled 2017-08-31 (×6): qty 2
  Filled 2017-08-31: qty 1
  Filled 2017-08-31 (×2): qty 2

## 2017-08-31 MED ORDER — OXYCODONE HCL 5 MG PO TABS: 5.0000 mg | ORAL_TABLET | Freq: Once | ORAL | Status: DC | PRN

## 2017-08-31 MED ORDER — BELLADONNA ALKALOIDS-OPIUM 16.2-60 MG RE SUPP
1.0000 | Freq: Four times a day (QID) | RECTAL | Status: DC | PRN
  Administered 2017-09-01: 1 via RECTAL
  Filled 2017-08-31: qty 1

## 2017-08-31 MED ORDER — OXYCODONE-ACETAMINOPHEN 5-325 MG PO TABS
ORAL_TABLET | ORAL | Status: AC
  Filled 2017-08-31: qty 2

## 2017-08-31 MED ORDER — SUGAMMADEX SODIUM 500 MG/5ML IV SOLN
INTRAVENOUS | Status: DC | PRN
  Administered 2017-08-31: 250 mg via INTRAVENOUS

## 2017-08-31 MED ORDER — PROPOFOL 10 MG/ML IV BOLUS
INTRAVENOUS | Status: AC
  Filled 2017-08-31: qty 20

## 2017-08-31 MED ORDER — ROCURONIUM BROMIDE 10 MG/ML (PF) SYRINGE
PREFILLED_SYRINGE | INTRAVENOUS | Status: AC
  Filled 2017-08-31: qty 5

## 2017-08-31 MED ORDER — ONDANSETRON HCL 4 MG/2ML IJ SOLN: 4.0000 mg | INTRAMUSCULAR | Status: DC | PRN

## 2017-08-31 MED ORDER — SODIUM CHLORIDE 0.9 % IR SOLN
3000.0000 mL | Status: DC
  Administered 2017-08-31: 3000 mL

## 2017-08-31 MED ORDER — SUGAMMADEX SODIUM 500 MG/5ML IV SOLN
INTRAVENOUS | Status: AC
  Filled 2017-08-31: qty 5

## 2017-08-31 SURGICAL SUPPLY — 16 items
BAG URINE DRAINAGE (UROLOGICAL SUPPLIES) ×4 IMPLANT
BAG URO CATCHER STRL LF (MISCELLANEOUS) ×4 IMPLANT
CATH FOLEY 3WAY 30CC 22FR (CATHETERS) ×4 IMPLANT
COVER FOOTSWITCH UNIV (MISCELLANEOUS) ×4 IMPLANT
ELECT REM PT RETURN 15FT ADLT (MISCELLANEOUS) ×4 IMPLANT
EVACUATOR MICROVAS BLADDER (UROLOGICAL SUPPLIES) IMPLANT
GLOVE BIOGEL M 8.0 STRL (GLOVE) ×4 IMPLANT
GOWN STRL REUS W/TWL LRG LVL3 (GOWN DISPOSABLE) ×4 IMPLANT
GOWN STRL REUS W/TWL XL LVL3 (GOWN DISPOSABLE) ×4 IMPLANT
LOOP CUT BIPOLAR 24F LRG (ELECTROSURGICAL) ×4 IMPLANT
MANIFOLD NEPTUNE II (INSTRUMENTS) ×4 IMPLANT
PACK CYSTO (CUSTOM PROCEDURE TRAY) ×4 IMPLANT
SET ASPIRATION TUBING (TUBING) IMPLANT
SYRINGE IRR TOOMEY STRL 70CC (SYRINGE) IMPLANT
TUBING CONNECTING 10 (TUBING) ×3 IMPLANT
TUBING CONNECTING 10' (TUBING) ×1

## 2017-08-31 NOTE — Progress Notes (Signed)
Final EKG done 08/30/17-epic

## 2017-08-31 NOTE — Anesthesia Postprocedure Evaluation (Signed)
Anesthesia Post Note  Patient: Nathaniel Kirk  Procedure(s) Performed: TRANSURETHRAL RESECTION OF BLADDER TUMOR (TURBT) (N/A ) CYSTOSCOPY WITH RETROGRADE PYELOGRAM (Bilateral )     Patient location during evaluation: PACU Anesthesia Type: General Level of consciousness: awake and alert Pain management: pain level controlled Vital Signs Assessment: post-procedure vital signs reviewed and stable Respiratory status: spontaneous breathing, nonlabored ventilation and respiratory function stable Cardiovascular status: blood pressure returned to baseline and stable Postop Assessment: no apparent nausea or vomiting Anesthetic complications: no    Last Vitals:  Vitals:   08/31/17 1345 08/31/17 1415  BP: (!) 147/89   Pulse: 69   Resp: 20 15  Temp:    SpO2: 100%     Last Pain:  Vitals:   08/31/17 1415  TempSrc:   PainSc: [place]

## 2017-08-31 NOTE — Discharge Instructions (Signed)
Indwelling Urinary Catheter Care, Adult  Take good care of your catheter to keep it working and to prevent problems.  How to wear your catheter  Attach your catheter to your leg with tape (adhesive tape) or a leg strap. Make sure it is not too tight. If you use tape, remove any bits of tape that are already on the catheter.  How to wear a drainage bag  You should have:   A large overnight bag.   A small leg bag.    Overnight Bag  You may wear the overnight bag at any time. Always keep the bag below the level of your bladder but off the floor. When you sleep, put a clean plastic bag in a wastebasket. Then hang the bag inside the wastebasket.  Leg Bag  Never wear the leg bag at night. Always wear the leg bag below your knee. Keep the leg bag secure with a leg strap or tape.  How to care for your skin   Clean the skin around the catheter at least once every day.   Shower every day. Do not take baths.   Put creams, lotions, or ointments on your genital area only as told by your doctor.   Do not use powders, sprays, or lotions on your genital area.  How to clean your catheter and your skin  1. Wash your hands with soap and water.  2. Wet a washcloth in warm water and gentle (mild) soap.  3. Use the washcloth to clean the skin where the catheter enters your body. Clean downward and wipe away from the catheter in small circles. Do not wipe toward the catheter.  4. Pat the area dry with a clean towel. Make sure to clean off all soap.  How to care for your drainage bags  Empty your drainage bag when it is ?- full or at least 2-3 times a day. Replace your drainage bag once a month or sooner if it starts to smell bad or look dirty. Do not clean your drainage bag unless told by your doctor.  Emptying a drainage bag    Supplies Needed   Rubbing alcohol.   Gauze pad or cotton ball.   Tape or a leg strap.    Steps  1. Wash your hands with soap and water.  2. Separate (detach) the bag from your leg.  3. Hold the bag over  the toilet or a clean container. Keep the bag below your hips and bladder. This stops pee (urine) from going back into the tube.  4. Open the pour spout at the bottom of the bag.  5. Empty the pee into the toilet or container. Do not let the pour spout touch any surface.  6. Put rubbing alcohol on a gauze pad or cotton ball.  7. Use the gauze pad or cotton ball to clean the pour spout.  8. Close the pour spout.  9. Attach the bag to your leg with tape or a leg strap.  10. Wash your hands.    Changing a drainage bag  Supplies Needed   Alcohol wipes.   A clean drainage bag.   Adhesive tape or a leg strap.    Steps  1. Wash your hands with soap and water.  2. Separate the dirty bag from your leg.  3. Pinch the rubber catheter with your fingers so that pee does not spill out.  4. Separate the catheter tube from the drainage tube where these tubes connect (at the   connection valve). Do not let the tubes touch any surface.  5. Clean the end of the catheter tube with an alcohol wipe. Use a different alcohol wipe to clean the end of the drainage tube.  6. Connect the catheter tube to the drainage tube of the clean bag.  7. Attach the new bag to the leg with adhesive tape or a leg strap.  8. Wash your hands.    How to prevent infection and other problems   Never pull on your catheter or try to remove it. Pulling can damage tissue in your body.   Always wash your hands before and after touching your catheter.   If a leg strap gets wet, replace it with a dry one.   Drink enough fluids to keep your pee clear or pale yellow, or as told by your doctor.   Do not let the drainage bag or tubing touch the floor.   Wear cotton underwear.   If you are male, wipe from front to back after you poop (have a bowel movement).   Check on the catheter often to make sure it works and the tubing is not twisted.  Get help if:   Your pee is cloudy.   Your pee smells unusually bad.   Your pee is not draining into the bag.   Your  tube gets clogged.   Your catheter starts to leak.   Your bladder feels full.  Get help right away if:   You have redness, swelling, or pain where the catheter enters your body.   You have fluid, pus, or a bad smell coming from the area where the catheter enters your body.   The area where the catheter enters your body feels warm.   You have a fever.   You have pain in your:  ? Stomach (abdomen).  ? Legs.  ? Lower back.  ? Bladder.   You see blood fill the catheter.   Your pee is pink or red.   You feel sick to your stomach (nauseous).   You throw up (vomit).   You have chills.   Your catheter gets pulled out.  This information is not intended to replace advice given to you by your health care provider. Make sure you discuss any questions you have with your health care provider.  Document Released: 11/04/2012 Document Revised: 06/07/2016 Document Reviewed: 12/23/2013  Elsevier Interactive Patient Education  2018 [employer]

## 2017-08-31 NOTE — Op Note (Signed)
.  Preoperative diagnosis: bladder tumor  Postoperative diagnosis: Same  Procedure: 1 cystoscopy 2. bilateral retrograde pyelography 3.  Intraoperative fluoroscopy, under one hour, with interpretation 4.  Transurethral resection of bladder tumor, large  Attending: Rosie Fate  Anesthesia: General  Estimated blood loss: Minimal  Drains: 22 French foley  Specimens: bladder tumor  Antibiotics: ancef  Findings: 5cm sessile necrotic dome tumor. 1cm papillary left lateral wall tumor  Ureteral orifices in normal anatomic location. Nor hydronephrosis or filling defects in either collecting system  Indications: Patient is a 72 year old male with a history of bladder tumor and gross hematuria.  After discussing treatment options, they decided proceed with transurethral resection of a bladder tumor.  Procedure her in detail: The patient was brought to the operating room and a brief timeout was done to ensure correct patient, correct procedure, correct site.  General anesthesia was administered patient was placed in dorsal lithotomy position.  Their genitalia was then prepped and draped in usual sterile fashion.  A rigid 42 French cystoscope was passed in the urethra and the bladder.  Bladder was inspected and we noted a 5cm bladder tumor at the dome and a 1cm left lateral wall tumor.  the ureteral orifices were in the normal orthotopic locations.  a 6 french ureteral catheter was then instilled into the left ureteral orifice.  a gentle retrograde was obtained and findings noted above. We then turned our attention to the right side. a 6 french ureteral catheter was then instilled into the right ureteral orifice.  a gentle retrograde was obtained and findings noted above. We then removed the cystoscope and placed a resectoscope into the bladder. We then turned our attention to the bladder tumors. Using the bipolar resectoscope we removed the bladder tumor down to the base. A subsequent muscle deep  biopsy was then taken. Hemostasis was then obtained with electrocautery. We then removed the bladder tumor chips and sent them for pathology. We then re-inspected the bladder and found no residula bleeding.  the bladder was then drained, a 22 French foley was placed and this concluded the procedure which was well tolerated by patient.  Complications: None  Condition: Stable, extubated, transferred to PACU  Plan: Patient is admitted overnight with continuous bladder irrigation. If their urine is clear tomorrow they will be discharged home and followup in 5 days for foley catheter removal and pathology discussion.

## 2017-08-31 NOTE — H&P (Signed)
Urology Admission H&P  Chief Complaint: gross hematuria  History of Present Illness: Mr Nathaniel Kirk is a 72yo with gross hematuria who was found to have a bladder tumor on office cystoscopy. He denies worsening LUTS. He continues to have gross painless hematuria  Past Medical History:  Diagnosis Date  . Arthritis    knees  . 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 surgery scheduled with Dr. Bridgett Larsson 09-10-17  . Pneumonia    walking PNA  2016 or 2017   Past Surgical History:  Procedure Laterality Date  . COLON SURGERY     diverticulitis foot of colon out  . HERNIA REPAIR     umbilical  . TONSILLECTOMY     as kid    Home Medications:  Current Facility-Administered Medications  Medication Dose Route Frequency Provider Last Rate Last Dose  . ceFAZolin (ANCEF) IVPB 2g/100 mL premix  2 g Intravenous 30 min Pre-Op Alyson Ingles Candee Furbish, MD      . lactated ringers infusion   Intravenous Continuous Lyndle Herrlich, MD 50 mL/hr at 08/31/17 8882     Allergies: No Known Allergies  History reviewed. No pertinent family history. Social History:  reports that he has been smoking.  he has never used smokeless tobacco. He reports that he drinks alcohol. He reports that he does not use drugs.  Review of Systems  Genitourinary: Positive for hematuria.  All other systems reviewed and are negative.   Physical Exam:  Vital signs in last 24 hours: Temp:  [98.1 F (36.7 C)] 98.1 F (36.7 C) (02/08 0912) Pulse Rate:  [66] 66 (02/08 0912) Resp:  [18] 18 (02/08 0912) BP: (149)/(86) 149/86 (02/08 0912) SpO2:  [98 %] 98 % (02/08 0912) Weight:  [115.7 kg (255 lb)] 115.7 kg (255 lb) (02/08 0936) Physical Exam  Constitutional: He is oriented to person, place, and time. He appears well-developed and well-nourished.  HENT:  Head: Normocephalic and atraumatic.  Eyes: EOM are normal. Pupils are equal, round, and reactive to light.  Neck: Normal range of motion. No  thyromegaly present.  Cardiovascular: Normal rate and regular rhythm.  Respiratory: Effort normal. No respiratory distress.  GI: Soft. He exhibits no distension.  Musculoskeletal: Normal range of motion. He exhibits no edema.  Neurological: He is alert and oriented to person, place, and time.  Skin: Skin is warm and dry.  Psychiatric: He has a normal mood and affect. His behavior is normal. Judgment and thought content normal.    Laboratory Data:  No results found for this or any previous visit (from the past 24 hour(s)). No results found for this or any previous visit (from the past 240 hour(s)). Creatinine: Recent Labs    08/30/17 0939  CREATININE 0.96   Baseline Creatinine: 0.96  Impression/Assessment:  71yo with a bladder tumor  Plan:  The risks/benefits/alternatives to bladder tumor resection was explained to the patient and he understands and wishes to proceed with surgery  Nicolette Bang 08/31/2017, 11:30 AM

## 2017-08-31 NOTE — Anesthesia Preprocedure Evaluation (Signed)
Anesthesia Evaluation  Patient identified by MRN, date of birth, ID band Patient awake    Reviewed: Allergy & Precautions, NPO status , Patient's Chart, lab work & pertinent test results  Airway Mallampati: II  TM Distance: >3 FB Neck ROM: Full    Dental  (+) Dental Advisory Given   Pulmonary Current Smoker,    Pulmonary exam normal breath sounds clear to auscultation       Cardiovascular + Peripheral Vascular Disease  Normal cardiovascular exam Rhythm:Regular Rate:Normal  CT Abdomen 2019 - Abdominal aortic aneurysm is not significantly changed with a maximal diameter of 5.3 cm compare with 5.4 cm on the prior study. 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 Significant narrowing at the origin of the SMA. There is some degree of narrowing at the origin of the celiac. Significant narrowing at the origin of the right renal artery. This is corroborated by diminished arterial enhancement of the right renal parenchyma with respect to that of the left. 3.4 cm bladder mass.  Malignancy is not excluded.   Neuro/Psych negative neurological ROS  negative psych ROS   GI/Hepatic negative GI ROS, Neg liver ROS,   Endo/Other  Obesity  Renal/GU negative Renal ROS   Bladder tumor    Musculoskeletal  (+) Arthritis ,   Abdominal (+) + obese,   Peds  Hematology negative hematology ROS (+)   Anesthesia Other Findings   Reproductive/Obstetrics                             Anesthesia Physical Anesthesia Plan  ASA: III  Anesthesia Plan: General   Post-op Pain Management:    Induction: Intravenous  PONV Risk Score and Plan: 3 and Treatment may vary due to age or medical condition, Ondansetron and Dexamethasone  Airway Management Planned: LMA and Oral ETT  Additional Equipment: None  Intra-op Plan:   Post-operative  Plan: Extubation in OR  Informed Consent: I have reviewed the patients History and Physical, chart, labs and discussed the procedure including the risks, benefits and alternatives for the proposed anesthesia with the patient or authorized representative who has indicated his/her understanding and acceptance.   Dental advisory given  Plan Discussed with: CRNA  Anesthesia Plan Comments: (LMA if no paralysis requested by surgeon, otherwise ETT)        Anesthesia Quick Evaluation

## 2017-08-31 NOTE — Anesthesia Procedure Notes (Addendum)
Procedure Name: Intubation Date/Time: 08/31/2017 12:49 PM Performed by: Anne Fu, CRNA Pre-anesthesia Checklist: Patient identified, Emergency Drugs available, Suction available, Patient being monitored and Timeout performed Patient Re-evaluated:Patient Re-evaluated prior to induction Oxygen Delivery Method: Circle system utilized Preoxygenation: Pre-oxygenation with 100% oxygen Induction Type: IV induction Ventilation: Mask ventilation without difficulty Laryngoscope Size: Miller and 2 Grade View: Grade I Tube type: Oral Tube size: 7.5 mm Number of attempts: 1 Airway Equipment and Method: Stylet Placement Confirmation: ETT inserted through vocal cords under direct vision,  positive ETCO2 and breath sounds checked- equal and bilateral Secured at: 21 cm Tube secured with: Tape Dental Injury: Teeth and Oropharynx as per pre-operative assessment

## 2017-08-31 NOTE — Transfer of Care (Signed)
Immediate Anesthesia Transfer of Care Note  Patient: Nathaniel Kirk  Procedure(s) Performed: Procedure(s): TRANSURETHRAL RESECTION OF BLADDER TUMOR (TURBT) (N/A) CYSTOSCOPY WITH RETROGRADE PYELOGRAM (Bilateral)  Patient Location: PACU  Anesthesia Type:General  Level of Consciousness:  sedated, patient cooperative and responds to stimulation  Airway & Oxygen Therapy:Patient Spontanous Breathing and Patient connected to face mask oxgen  Post-op Assessment:  Report given to PACU RN and Post -op Vital signs reviewed and stable  Post vital signs:  Reviewed and stable  Last Vitals:  Vitals:   08/31/17 0912  BP: (!) 149/86  Pulse: 66  Resp: 18  Temp: 36.7 C  SpO2: 37%    Complications: No apparent anesthesia complications

## 2017-09-01 ENCOUNTER — Encounter (HOSPITAL_COMMUNITY): Payer: Self-pay | Admitting: Urology

## 2017-09-01 DIAGNOSIS — M170 Bilateral primary osteoarthritis of knee: Secondary | ICD-10-CM | POA: Diagnosis present

## 2017-09-01 DIAGNOSIS — D494 Neoplasm of unspecified behavior of bladder: Secondary | ICD-10-CM | POA: Diagnosis present

## 2017-09-01 DIAGNOSIS — I714 Abdominal aortic aneurysm, without rupture: Secondary | ICD-10-CM | POA: Diagnosis present

## 2017-09-01 DIAGNOSIS — I723 Aneurysm of iliac artery: Secondary | ICD-10-CM | POA: Diagnosis present

## 2017-09-01 DIAGNOSIS — F17200 Nicotine dependence, unspecified, uncomplicated: Secondary | ICD-10-CM | POA: Diagnosis present

## 2017-09-01 DIAGNOSIS — Z8701 Personal history of pneumonia (recurrent): Secondary | ICD-10-CM | POA: Diagnosis not present

## 2017-09-01 DIAGNOSIS — R1030 Lower abdominal pain, unspecified: Secondary | ICD-10-CM | POA: Diagnosis not present

## 2017-09-01 DIAGNOSIS — E669 Obesity, unspecified: Secondary | ICD-10-CM | POA: Diagnosis present

## 2017-09-01 DIAGNOSIS — Z6838 Body mass index (BMI) 38.0-38.9, adult: Secondary | ICD-10-CM | POA: Diagnosis not present

## 2017-09-01 LAB — CBC
HCT: 43.4 % (ref 39.0–52.0)
Hemoglobin: 14.4 g/dL (ref 13.0–17.0)
MCH: 30.9 pg (ref 26.0–34.0)
MCHC: 33.2 g/dL (ref 30.0–36.0)
MCV: 93.1 fL (ref 78.0–100.0)
PLATELETS: 177 10*3/uL (ref 150–400)
RBC: 4.66 MIL/uL (ref 4.22–5.81)
RDW: 13.7 % (ref 11.5–15.5)
WBC: 13.3 10*3/uL — AB (ref 4.0–10.5)

## 2017-09-01 LAB — BASIC METABOLIC PANEL
ANION GAP: 4 — AB (ref 5–15)
BUN: 18 mg/dL (ref 6–20)
CALCIUM: 8.7 mg/dL — AB (ref 8.9–10.3)
CO2: 26 mmol/L (ref 22–32)
Chloride: 105 mmol/L (ref 101–111)
Creatinine, Ser: 0.9 mg/dL (ref 0.61–1.24)
Glucose, Bld: 129 mg/dL — ABNORMAL HIGH (ref 65–99)
POTASSIUM: 4.5 mmol/L (ref 3.5–5.1)
SODIUM: 135 mmol/L (ref 135–145)

## 2017-09-01 MED ORDER — MENTHOL 3 MG MT LOZG
1.0000 | LOZENGE | OROMUCOSAL | Status: DC | PRN
  Administered 2017-09-01: 3 mg via ORAL
  Filled 2017-09-01: qty 9

## 2017-09-01 NOTE — Progress Notes (Signed)
Patient ID: Nathaniel Kirk, male   DOB: [birth year], 72 y.o.   MRN: [id number]   The patient was noted to have nearly clear urine when I rounded this morning however his urine has become bloody.  Because of that it was felt his discharge should be held.  He was placed back on CBI and will be reassessed in the morning for potential discharge that time.

## 2017-09-01 NOTE — Progress Notes (Signed)
Patient to be discharged per MD Ottelin. However, after CBI was stopped patient's urine became increasingly thick and red, no clots noted. Patient also experience increased discomfort in bladder. Daughters in room and very concerned about color of urine, stating urine is more red now than it was after surgery yesterday. MD Ottelin made aware and gave VO to keep patient for observation overnight, along with restart CBI. MD also spoke with daughter on phone. Patient agrees with plan and expressed relief with restart of CBI. Denies need for pain medication or B&O suppository at this time.

## 2017-09-01 NOTE — Discharge Summary (Signed)
Physician Discharge Summary      Patient ID: Nathaniel Kirk MRN: [id number] DOB/AGE: 73-Jan-1947 73 y.o.  Admit date: 08/31/2017 Discharge date: 09/01/2017  Admission Diagnoses: BLADDER TUMOR  Discharge Diagnoses:  Active Problems:   Bladder tumor   Discharged Condition: good  Hospital Course: Patient was admitted for elective transurethral resection of a bladder tumor.  He was found to have what appeared to be a partially necrotic tumor at the dome that was resected.  He was observed overnight with Foley catheter indwelling and has done well.  He denies any pain.  He is tolerating his catheter.  His urine is currently slightly pink has no clots and his abdomen is nontender and is felt ready for discharge at this time.  Significant Diagnostic Studies: Dg C-arm 1-60 Min-no Report  Result Date: 08/31/2017 Fluoroscopy was utilized by the requesting physician.  No radiographic interpretation.    Discharge Exam: Blood pressure 112/70, pulse 76, temperature 97.9 F (36.6 C), temperature source Oral, resp. rate 18, height 5\' 9"  (1.753 m), weight 118 kg (260 lb 2.3 oz), SpO2 96 %.  Awake, alert and in no apparent distress. Cardiovascular: Regular rate and rhythm Chest: Normal respiratory effort Abdomen is obese, soft and nontender. GU: Foley catheter indwelling draining light pink urine with no clots.  Disposition: Final discharge disposition not confirmed   Allergies as of 09/01/2017   No Known Allergies     Medication List    TAKE these medications   cetirizine-pseudoephedrine 5-120 MG tablet Commonly known as:  ZYRTEC-D Take 1 tablet by mouth 2 (two) times daily as needed for allergies.   ibuprofen 200 MG tablet Commonly known as:  ADVIL,MOTRIN Take 400 mg by mouth as needed for moderate pain.   naproxen sodium 220 MG tablet Commonly known as:  ALEVE Take 220 mg by mouth 2 (two) times daily as needed (for knee pain/arthritis pain.).   oxyCODONE 5 MG immediate release  tablet Commonly known as:  Oxy IR/ROXICODONE Take 1 tablet (5 mg total) by mouth every 4 (four) hours as needed (for pain score of 1-4).      Follow-up Information    McKenzie, Candee Furbish, MD. Call in 1 week.   Specialty:  Urology Contact information: [address] [place] [address] [phone]           Signed: Claybon Jabs 09/01/2017, 8:16 AM

## 2017-09-02 NOTE — Progress Notes (Signed)
2 Days Post-Op Subjective: Patient reports no complaints currently.  He was felt to be ready for discharge yesterday morning but his urine became bloody when his CBI was stopped.  I therefore restarted his CBI and kept him in the hospital.  He did well overnight.  I had the CBI stopped this morning and it has been off for about an hour and the urine in the catheter tubing is slightly pink only.  I do not not see any clots.  Objective: Vital signs in last 24 hours: Temp:  [97.4 F (36.3 C)-98.5 F (36.9 C)] 97.4 F (36.3 C) (02/10 0426) Pulse Rate:  [68-76] 76 (02/10 0426) Resp:  [16-20] 16 (02/10 0426) BP: (105-163)/(64-82) 163/82 (02/10 0426) SpO2:  [94 %-98 %] 98 % (02/10 0426)  Intake/Output from previous day: 02/09 0701 - 02/10 0700 In: 16800 [I.V.:1000] Out: 18925 [Urine:18925] Intake/Output this shift: Total I/O In: 7150 [I.V.:350; Other:6800] Out: [place] [Urine:7225]  Physical Exam:  General: He is awake, alert and in no distress. Abdomen: Soft and nontender.  No SP tenderness. GU: Foley catheter indwelling.  Urine slightly pink with CBI off at this time.  Lab Results: Recent Labs    08/30/17 0939 08/31/17 1855 09/01/17 0437  HGB 15.7 15.3 14.4  HCT 45.9 45.5 43.4   BMET Recent Labs    08/31/17 1855 09/01/17 0437  NA 134* 135  K 4.3 4.5  CL 102 105  CO2 22 26  GLUCOSE 161* 129*  BUN 19 18  CREATININE 0.93 0.90  CALCIUM 8.5* 8.7*   No results for input(s): LABPT, INR in the last 72 hours. No results for input(s): LABURIN in the last 72 hours. No results found for this or any previous visit.  Studies/Results: Dg C-arm 1-60 Min-no Report  Result Date: 08/31/2017 Fluoroscopy was utilized by the requesting physician.  No radiographic interpretation.    Assessment/Plan: Status post TURBT: He had a large, necrotic tumor resected from the dome of his bladder and had been on CBI overnight postoperatively.  The following morning I thought he was clear enough  for discharge but when his CBI was stopped and he was up moving around he became bloody again and I canceled his discharge, placed him back on CBI and it appears he is clearing now with still some blood in the urine but his CBI is off.  I want to see how he does over the morning with his CBI off and if he continues to do well we will consider discharge later this morning.  Maintain CBI off for now.  Hand irrigate as needed.  Possible discharge later today as long as he does well off of CBI.   LOS: 1 day   Jersey Espinoza C 09/02/2017, 5:56 AM

## 2017-09-02 NOTE — Progress Notes (Signed)
Patient's urine more pink at times, but continues to be mostly red and clear. No clots seen this shift. FC draining well with no complications, no need to hand irrigate this shift. Daughters present at bedside and concerned again about patient being D/C'd with red urine. CBI stopped at 0430 per previous RN. MD Ottelin notified who spoke with daughter and gave VO to keep patient overnight again and restart CBI. Will continue to monitor and titrate as able.

## 2017-09-03 NOTE — Progress Notes (Signed)
3 Days Post-Op Subjective: Patient reports no complaints currently.  His CBI was stopped this morning and urine is light pink. He complains of intermittent sharp suprapubic pain Objective: Vital signs in last 24 hours: Temp:  [97.5 F (36.4 C)-98.4 F (36.9 C)] 97.5 F (36.4 C) (02/11 1316) Pulse Rate:  [67-78] 78 (02/11 1316) Resp:  [16-18] 16 (02/11 1316) BP: (110-141)/(74-89) 141/82 (02/11 1316) SpO2:  [97 %-100 %] 100 % (02/11 1316)  Intake/Output from previous day: 02/10 0701 - 02/11 0700 In: 7380 [P.O.:780; I.V.:600] Out: 6375 [Urine:6375] Intake/Output this shift: Total I/O In: -  Out: 825 [Urine:825]  Physical Exam:  General: He is awake, alert and in no distress. Abdomen: Soft and nontender.  No SP tenderness. GU: Foley catheter indwelling.  Urine slightly pink with CBI off at this time.  Lab Results: Recent Labs    08/31/17 1855 09/01/17 0437  HGB 15.3 14.4  HCT 45.5 43.4   BMET Recent Labs    08/31/17 1855 09/01/17 0437  NA 134* 135  K 4.3 4.5  CL 102 105  CO2 22 26  GLUCOSE 161* 129*  BUN 19 18  CREATININE 0.93 0.90  CALCIUM 8.5* 8.7*   No results for input(s): LABPT, INR in the last 72 hours. No results for input(s): LABURIN in the last 72 hours. No results found for this or any previous visit.  Studies/Results: No results found.  Assessment/Plan: Status post TURBT:  -Hand irrigate as needed. -discharge tomorrow after voiding trial  LOS: 2 days   Nicolette Bang 09/03/2017, 3:15 PM

## 2017-09-04 NOTE — Progress Notes (Signed)
Patient discharged home with daughter, discharge instructions given and explained to patient and he verbalized understanding, patient denies any pain/distress,No wound noted, skin intact.  Accompanied home by daughter.

## 2017-09-05 NOTE — Pre-Procedure Instructions (Signed)
Nealy Karapetian  09/05/2017      [id number] - [place], [place] - [address] [place] [address] Phone: [phone] Fax: [phone]    Your procedure is scheduled on 09-10-2017. Monday  Report to [facility] Admitting at 5:30 A.M.  Call this number if you have problems the morning of surgery:  [phone]   Remember:  Do not eat food or drink liquids after midnight.   Take these medicines the morning of surgery with A SIP OF WATER  Zyrtec-D Pain medication if needed  STOP ASPIRIN,ANTIINFLAMATORIES (IBUPROFEN,ALEVE,MOTRIN,ADVIL,GOODY'S POWDERS),HERBAL SUPPLEMENTS,FISH OIL,AND VITAMINS 5-7 DAYS PRIOR TO SURGERY   Do not wear jewelry.  Do not wear lotions, powders, or perfumes, or deodorant.  Do not shave 48 hours prior to surgery.  Men may shave face and neck.   Do not bring valuables to the hospital.  [facility] is not responsible for any belongings or valuables.  Contacts, dentures or bridgework may not be worn into surgery.  Leave your suitcase in the car.  After surgery it may be brought to your room.  For patients admitted to the hospital, discharge time will be determined by your treatment team.  Patients discharged the day of surgery will not be allowed to drive home.    Special Instructions: [place] [facility] - Preparing for Surgery  Before surgery, you can play an important role.  Because skin is not sterile, your skin needs to be as free of germs as possible.  You can reduce the number of germs on you skin by washing with CHG (chlorahexidine gluconate) soap before surgery.  CHG is an antiseptic cleaner which kills germs and bonds with the skin to continue killing germs even after washing.  Please DO NOT use if you have an allergy to CHG or antibacterial soaps.  If your skin becomes reddened/irritated stop using the CHG and inform your nurse when you arrive at Short Stay.  Do not shave (including legs and underarms) for at  least 48 hours prior to the first CHG shower.  You may shave your face.  Please follow these instructions carefully:   1.  Shower with CHG Soap the night before surgery and the   morning of Surgery.  2.  If you choose to wash your hair, wash your hair first as usual with your normal shampoo.  3.  After you shampoo, rinse your hair and body thoroughly to remove the  Shampoo.  4.  Use CHG as you would any other liquid soap.  You can apply chg directly  to the skin and wash gently with scrungie or a clean washcloth.  5.  Apply the CHG Soap to your body ONLY FROM THE NECK DOWN.   Do not use on open wounds or open sores.  Avoid contact with your eyes,  ears, mouth and genitals (private parts).  Wash genitals (private parts) with your normal soap.  6.  Wash thoroughly, paying special attention to the area where your surgery will be performed.  7.  Thoroughly rinse your body with warm water from the neck down.  8.  DO NOT shower/wash with your normal soap after using and rinsing o  the CHG Soap.  9.  Pat yourself dry with a clean towel.            10.  Wear clean pajamas.            11.  Place clean sheets on your bed the night of your  first shower and do not sleep with pets.  Day of Surgery  Do not apply any lotions/deodorants the morning of surgery.  Please wear clean clothes to the hospital/surgery center.   Please read over the following fact sheets that you were given. Coughing and Deep Breathing, MRSA Information and Surgical Site Infection Prevention

## 2017-09-06 ENCOUNTER — Inpatient Hospital Stay (HOSPITAL_COMMUNITY): Admission: RE | Admit: 2017-09-06 | Payer: Medicare Other | Source: Ambulatory Visit

## 2017-09-06 ENCOUNTER — Encounter (HOSPITAL_COMMUNITY)
Admission: RE | Admit: 2017-09-06 | Discharge: 2017-09-06 | Disposition: A | Discharge status: Home / Self Care | Payer: Medicare Other | Source: Ambulatory Visit | Attending: Vascular Surgery | Admitting: Vascular Surgery

## 2017-09-06 ENCOUNTER — Telehealth: Payer: Self-pay | Admitting: *Deleted

## 2017-09-06 ENCOUNTER — Encounter (HOSPITAL_COMMUNITY): Payer: Self-pay

## 2017-09-06 DIAGNOSIS — F17200 Nicotine dependence, unspecified, uncomplicated: Secondary | ICD-10-CM | POA: Diagnosis not present

## 2017-09-06 DIAGNOSIS — N390 Urinary tract infection, site not specified: Secondary | ICD-10-CM | POA: Diagnosis not present

## 2017-09-06 DIAGNOSIS — Z9889 Other specified postprocedural states: Secondary | ICD-10-CM | POA: Diagnosis not present

## 2017-09-06 DIAGNOSIS — I714 Abdominal aortic aneurysm, without rupture: Secondary | ICD-10-CM | POA: Insufficient documentation

## 2017-09-06 DIAGNOSIS — Z01812 Encounter for preprocedural laboratory examination: Secondary | ICD-10-CM | POA: Insufficient documentation

## 2017-09-06 DIAGNOSIS — N329 Bladder disorder, unspecified: Secondary | ICD-10-CM | POA: Insufficient documentation

## 2017-09-06 DIAGNOSIS — Z01818 Encounter for other preprocedural examination: Secondary | ICD-10-CM | POA: Diagnosis not present

## 2017-09-06 HISTORY — DX: Neoplasm of unspecified behavior of bladder: D49.4

## 2017-09-06 LAB — URINALYSIS, ROUTINE W REFLEX MICROSCOPIC
Bilirubin Urine: NEGATIVE
Glucose, UA: NEGATIVE mg/dL
Ketones, ur: NEGATIVE mg/dL
Nitrite: NEGATIVE
Protein, ur: 100 mg/dL — AB
Specific Gravity, Urine: 1.021 (ref 1.005–1.030)
Squamous Epithelial / HPF: NONE SEEN
pH: 5 (ref 5.0–8.0)

## 2017-09-06 LAB — COMPREHENSIVE METABOLIC PANEL WITH GFR
ALT: 20 U/L (ref 17–63)
AST: 24 U/L (ref 15–41)
Albumin: 3.7 g/dL (ref 3.5–5.0)
Alkaline Phosphatase: 59 U/L (ref 38–126)
Anion gap: 9 (ref 5–15)
BUN: 12 mg/dL (ref 6–20)
CO2: 26 mmol/L (ref 22–32)
Calcium: 9.2 mg/dL (ref 8.9–10.3)
Chloride: 105 mmol/L (ref 101–111)
Creatinine, Ser: 1 mg/dL (ref 0.61–1.24)
GFR calc Af Amer: >60 mL/min
GFR calc non Af Amer: >60 mL/min
Glucose, Bld: 103 mg/dL — ABNORMAL HIGH (ref 65–99)
Potassium: 4.4 mmol/L (ref 3.5–5.1)
Sodium: 140 mmol/L (ref 135–145)
Total Bilirubin: 1.1 mg/dL (ref 0.3–1.2)
Total Protein: 6.2 g/dL — ABNORMAL LOW (ref 6.5–8.1)

## 2017-09-06 LAB — CBC
HCT: 45.4 % (ref 39.0–52.0)
Hemoglobin: 15.1 g/dL (ref 13.0–17.0)
MCH: 31.7 pg (ref 26.0–34.0)
MCHC: 33.3 g/dL (ref 30.0–36.0)
MCV: 95.2 fL (ref 78.0–100.0)
Platelets: 179 10*3/uL (ref 150–400)
RBC: 4.77 MIL/uL (ref 4.22–5.81)
RDW: 13.6 % (ref 11.5–15.5)
WBC: 10.7 10*3/uL — ABNORMAL HIGH (ref 4.0–10.5)

## 2017-09-06 LAB — PROTIME-INR
INR: 1.06
Prothrombin Time: 13.7 s (ref 11.4–15.2)

## 2017-09-06 LAB — APTT: aPTT: 26 seconds (ref 24–36)

## 2017-09-06 LAB — SURGICAL PCR SCREEN
MRSA, PCR: NEGATIVE
Staphylococcus aureus: POSITIVE — AB

## 2017-09-06 MED ORDER — CHLORHEXIDINE GLUCONATE 4 % EX LIQD: 60.0000 mL | Freq: Once | CUTANEOUS | Status: DC

## 2017-09-06 NOTE — Progress Notes (Signed)
U/a results called to Dr Daphene Calamity office. I talked to [place]. Pt stated he was stating to have pain.I will f/u with PA

## 2017-09-06 NOTE — Telephone Encounter (Signed)
[facility] and spoke with Triage nurse(Riza) regarding patient's U/A results. She is to follow up with Dr. Alyson Ingles for treatment. Faxed result over per her request.

## 2017-09-06 NOTE — Final Progress Note (Signed)
Pt has turp on 08/31/17. He stated he was still passing blood post procedure.

## 2017-09-07 ENCOUNTER — Telehealth: Payer: Self-pay | Admitting: *Deleted

## 2017-09-07 NOTE — Progress Notes (Signed)
Anesthesia Chart Review:  Pt is a 72 year old male scheduled for abdominal aortic endovascular stent graft with right and possible left iliac branch extensions, embolization of left internal iliac artery on 09/10/2017 with Adele Barthel, MD  - PCP is Willey Blade, MD - Urologist is Nicolette Bang, MD  PMH includes: AAA, bladder tumor (s/p TURBT 08/31/17). Current smoker. BMI 38.0  Medications reviewed.   BP 122/62   Pulse 65   Temp 36.7 C   Resp 18   Ht 5' 9.5" (1.765 m)   Wt 261 lb (118.4 kg)   SpO2 100%   BMI 37.99 kg/m   Preoperative labs reviewed.   - UA with too numerous to count RBC, WBC.  - Pt is s/p recent TURBT. Per notes in Epic, pt has been placed on antibiotic for UTI  EKG 08/30/17: Sinus rhythm with PACs with Abberant conduction. Left atrial enlargement  CT angio abd/pelvis 08/24/17:  VASCULAR - Abdominal aortic aneurysm is not significantly changed with a maximal diameter of 5.3 cm compare with 5.4 cm on the prior study. 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 - Significant narrowing at the origin of the SMA. There is some degree of narrowing at the origin of the celiac. - Significant narrowing at the origin of the right renal artery. This is corroborated by diminished arterial enhancement of the right renal parenchyma with respect to that of the left.  NON-VASCULAR - There are several small pulmonary nodules which are not significantly changed compared with the recent study. Given the bladder mass, metastatic disease is not excluded. - 3.4 cm bladder mass.  Malignancy is not excluded.   If no changes, I anticipate pt can proceed with surgery as scheduled.   Willeen Cass, FNP-BC [facility] Short Stay Surgical Center/Anesthesiology Phone: [phone] 09/07/2017 9:43 AM

## 2017-09-07 NOTE — Telephone Encounter (Signed)
Spoke with Juliann Pares at [facility]. Antibiotic called in for patient by Dr. Alyson Ingles after he received U/a results from [facility]. This office called patient to start antibiotic now.

## 2017-09-10 ENCOUNTER — Inpatient Hospital Stay (HOSPITAL_COMMUNITY): Payer: Medicare Other | Admitting: Emergency Medicine

## 2017-09-10 ENCOUNTER — Encounter (HOSPITAL_COMMUNITY): Payer: Self-pay | Admitting: *Deleted

## 2017-09-10 ENCOUNTER — Inpatient Hospital Stay (HOSPITAL_COMMUNITY): Payer: Medicare Other

## 2017-09-10 ENCOUNTER — Encounter (HOSPITAL_COMMUNITY)
Admission: RE | Disposition: A | Discharge status: Home / Self Care | Payer: Self-pay | Source: Ambulatory Visit | Attending: Vascular Surgery

## 2017-09-10 ENCOUNTER — Inpatient Hospital Stay (HOSPITAL_COMMUNITY)
Admission: RE | Admit: 2017-09-10 | Discharge: 2017-09-11 | DRG: 254 | Disposition: A | Discharge status: Home / Self Care | Payer: Medicare Other | Source: Ambulatory Visit | Attending: Vascular Surgery | Admitting: Vascular Surgery

## 2017-09-10 DIAGNOSIS — Z79899 Other long term (current) drug therapy: Secondary | ICD-10-CM

## 2017-09-10 DIAGNOSIS — I714 Abdominal aortic aneurysm, without rupture, unspecified: Secondary | ICD-10-CM | POA: Diagnosis present

## 2017-09-10 DIAGNOSIS — I723 Aneurysm of iliac artery: Secondary | ICD-10-CM | POA: Diagnosis present

## 2017-09-10 DIAGNOSIS — F17210 Nicotine dependence, cigarettes, uncomplicated: Secondary | ICD-10-CM | POA: Diagnosis present

## 2017-09-10 DIAGNOSIS — C679 Malignant neoplasm of bladder, unspecified: Secondary | ICD-10-CM | POA: Diagnosis present

## 2017-09-10 HISTORY — PX: ABDOMINAL AORTIC ENDOVASCULAR STENT GRAFT: SHX5707

## 2017-09-10 HISTORY — DX: Urinary tract infection, site not specified: N39.0

## 2017-09-10 LAB — CBC
HCT: 32.7 % — ABNORMAL LOW (ref 39.0–52.0)
HEMOGLOBIN: 11 g/dL — AB (ref 13.0–17.0)
MCH: 32.2 pg (ref 26.0–34.0)
MCHC: 33.6 g/dL (ref 30.0–36.0)
MCV: 95.6 fL (ref 78.0–100.0)
Platelets: 137 10*3/uL — ABNORMAL LOW (ref 150–400)
RBC: 3.42 MIL/uL — ABNORMAL LOW (ref 4.22–5.81)
RDW: 13.9 % (ref 11.5–15.5)
WBC: 8.9 10*3/uL (ref 4.0–10.5)

## 2017-09-10 LAB — CREATININE, SERUM
CREATININE: 0.69 mg/dL (ref 0.61–1.24)
GFR calc Af Amer: >60 mL/min (ref >60)

## 2017-09-10 SURGERY — INSERTION, ENDOVASCULAR STENT GRAFT, AORTA, ABDOMINAL
Anesthesia: General | Site: Abdomen

## 2017-09-10 MED ORDER — HEPARIN SODIUM (PORCINE) 1000 UNIT/ML IJ SOLN
INTRAMUSCULAR | Status: AC
  Filled 2017-09-10: qty 1

## 2017-09-10 MED ORDER — POLYETHYLENE GLYCOL 3350 17 G PO PACK: 17.0000 g | PACK | Freq: Every day | ORAL | Status: DC | PRN

## 2017-09-10 MED ORDER — SODIUM CHLORIDE 0.9 % IV SOLN: INTRAVENOUS | Status: DC

## 2017-09-10 MED ORDER — DEXAMETHASONE SODIUM PHOSPHATE 10 MG/ML IJ SOLN
INTRAMUSCULAR | Status: AC
  Filled 2017-09-10: qty 1

## 2017-09-10 MED ORDER — PHENYLEPHRINE HCL 10 MG/ML IJ SOLN
INTRAVENOUS | Status: DC | PRN
  Administered 2017-09-10: 50 ug/min via INTRAVENOUS

## 2017-09-10 MED ORDER — LACTATED RINGERS IV SOLN
INTRAVENOUS | Status: DC | PRN
  Administered 2017-09-10: 07:00:00 via INTRAVENOUS

## 2017-09-10 MED ORDER — 0.9 % SODIUM CHLORIDE (POUR BTL) OPTIME
TOPICAL | Status: DC | PRN
  Administered 2017-09-10: 1000 mL

## 2017-09-10 MED ORDER — ONDANSETRON HCL 4 MG/2ML IJ SOLN: 4.0000 mg | Freq: Once | INTRAMUSCULAR | Status: DC | PRN

## 2017-09-10 MED ORDER — OXYCODONE-ACETAMINOPHEN 5-325 MG PO TABS
1.0000 | ORAL_TABLET | ORAL | Status: DC | PRN
  Administered 2017-09-11: 2 via ORAL
  Filled 2017-09-10: qty 2

## 2017-09-10 MED ORDER — PROTAMINE SULFATE 10 MG/ML IV SOLN
INTRAVENOUS | Status: DC | PRN
  Administered 2017-09-10: 50 mg via INTRAVENOUS
  Administered 2017-09-10: 10 mg via INTRAVENOUS

## 2017-09-10 MED ORDER — HYDRALAZINE HCL 20 MG/ML IJ SOLN: 5.0000 mg | INTRAMUSCULAR | Status: DC | PRN

## 2017-09-10 MED ORDER — PROPOFOL 10 MG/ML IV BOLUS
INTRAVENOUS | Status: AC
  Filled 2017-09-10: qty 40

## 2017-09-10 MED ORDER — FENTANYL CITRATE (PF) 100 MCG/2ML IJ SOLN
INTRAMUSCULAR | Status: DC | PRN
  Administered 2017-09-10: 50 ug via INTRAVENOUS
  Administered 2017-09-10 (×3): 25 ug via INTRAVENOUS
  Administered 2017-09-10 (×2): 100 ug via INTRAVENOUS
  Administered 2017-09-10: 50 ug via INTRAVENOUS
  Administered 2017-09-10 (×2): 25 ug via INTRAVENOUS
  Administered 2017-09-10: 50 ug via INTRAVENOUS
  Administered 2017-09-10: 25 ug via INTRAVENOUS

## 2017-09-10 MED ORDER — HYDROMORPHONE HCL 1 MG/ML IJ SOLN
INTRAMUSCULAR | Status: AC
  Filled 2017-09-10: qty 1

## 2017-09-10 MED ORDER — LABETALOL HCL 5 MG/ML IV SOLN: 10.0000 mg | INTRAVENOUS | Status: DC | PRN

## 2017-09-10 MED ORDER — LIDOCAINE HCL (CARDIAC) 20 MG/ML IV SOLN
INTRAVENOUS | Status: DC | PRN
  Administered 2017-09-10: 100 mg via INTRAVENOUS

## 2017-09-10 MED ORDER — FENTANYL CITRATE (PF) 250 MCG/5ML IJ SOLN
INTRAMUSCULAR | Status: AC
  Filled 2017-09-10: qty 5

## 2017-09-10 MED ORDER — EPHEDRINE 5 MG/ML INJ
INTRAVENOUS | Status: AC
  Filled 2017-09-10: qty 10

## 2017-09-10 MED ORDER — ONDANSETRON HCL 4 MG/2ML IJ SOLN
INTRAMUSCULAR | Status: AC
Start: 2017-09-10 — End: 2017-09-10
  Filled 2017-09-10: qty 2

## 2017-09-10 MED ORDER — IODIXANOL 320 MG/ML IV SOLN
INTRAVENOUS | Status: DC | PRN
  Administered 2017-09-10: 60 mL via INTRAVENOUS
  Administered 2017-09-10: 80.4 mL via INTRAVENOUS

## 2017-09-10 MED ORDER — LIDOCAINE 2% (20 MG/ML) 5 ML SYRINGE
INTRAMUSCULAR | Status: AC
Start: 2017-09-10 — End: 2017-09-10
  Filled 2017-09-10: qty 10

## 2017-09-10 MED ORDER — PHENOL 1.4 % MT LIQD: 1.0000 | OROMUCOSAL | Status: DC | PRN

## 2017-09-10 MED ORDER — POLYVINYL ALCOHOL 1.4 % OP SOLN
1.0000 [drp] | OPHTHALMIC | Status: DC | PRN
  Administered 2017-09-10: 1 [drp] via OPHTHALMIC
  Filled 2017-09-10: qty 15

## 2017-09-10 MED ORDER — ACETAMINOPHEN 650 MG RE SUPP: 325.0000 mg | RECTAL | Status: DC | PRN

## 2017-09-10 MED ORDER — SODIUM CHLORIDE 0.9 % IV SOLN
INTRAVENOUS | Status: DC | PRN
  Administered 2017-09-10 (×2): 500 mL

## 2017-09-10 MED ORDER — ROCURONIUM BROMIDE 10 MG/ML (PF) SYRINGE
PREFILLED_SYRINGE | INTRAVENOUS | Status: AC
  Filled 2017-09-10: qty 10

## 2017-09-10 MED ORDER — SORBITOL 70 % SOLN
30.0000 mL | Freq: Every day | Status: DC | PRN
  Filled 2017-09-10: qty 30

## 2017-09-10 MED ORDER — ACETAMINOPHEN 325 MG PO TABS: 325.0000 mg | ORAL_TABLET | ORAL | Status: DC | PRN

## 2017-09-10 MED ORDER — EYE WASH OPHTH SOLN: 1.0000 [drp] | OPHTHALMIC | Status: DC | PRN

## 2017-09-10 MED ORDER — SODIUM CHLORIDE 0.9 % IV SOLN
1.5000 g | INTRAVENOUS | Status: AC
  Administered 2017-09-10 (×2): 1.5 g via INTRAVENOUS
  Filled 2017-09-10: qty 1.5

## 2017-09-10 MED ORDER — LORATADINE 10 MG PO TABS: 10.0000 mg | ORAL_TABLET | Freq: Every day | ORAL | Status: DC

## 2017-09-10 MED ORDER — PHENYLEPHRINE 40 MCG/ML (10ML) SYRINGE FOR IV PUSH (FOR BLOOD PRESSURE SUPPORT)
PREFILLED_SYRINGE | INTRAVENOUS | Status: AC
  Filled 2017-09-10: qty 10

## 2017-09-10 MED ORDER — HEPARIN SODIUM (PORCINE) 1000 UNIT/ML IJ SOLN
INTRAMUSCULAR | Status: DC | PRN
  Administered 2017-09-10: 2000 [IU] via INTRAVENOUS
  Administered 2017-09-10: 12000 [IU] via INTRAVENOUS
  Administered 2017-09-10 (×2): 2000 [IU] via INTRAVENOUS

## 2017-09-10 MED ORDER — FLEET ENEMA 7-19 GM/118ML RE ENEM: 1.0000 | ENEMA | Freq: Once | RECTAL | Status: DC | PRN

## 2017-09-10 MED ORDER — SODIUM CHLORIDE 0.9 % IV SOLN
INTRAVENOUS | Status: AC
  Filled 2017-09-10: qty 1.5

## 2017-09-10 MED ORDER — SODIUM CHLORIDE 0.9 % IV SOLN
500.0000 mL | Freq: Once | INTRAVENOUS | Status: DC | PRN
Start: 2017-09-10 — End: 2017-09-11

## 2017-09-10 MED ORDER — ENOXAPARIN SODIUM 40 MG/0.4ML ~~LOC~~ SOLN
40.0000 mg | SUBCUTANEOUS | Status: DC
  Administered 2017-09-11: 40 mg via SUBCUTANEOUS
  Filled 2017-09-10: qty 0.4

## 2017-09-10 MED ORDER — SODIUM CHLORIDE 0.9 % IV SOLN
1.5000 g | Freq: Two times a day (BID) | INTRAVENOUS | Status: AC
  Administered 2017-09-10 – 2017-09-11 (×2): 1.5 g via INTRAVENOUS
  Filled 2017-09-10 (×2): qty 1.5

## 2017-09-10 MED ORDER — PROTAMINE SULFATE 10 MG/ML IV SOLN
INTRAVENOUS | Status: AC
  Filled 2017-09-10: qty 10

## 2017-09-10 MED ORDER — SODIUM CHLORIDE 0.9 % IV SOLN
INTRAVENOUS | Status: DC
  Administered 2017-09-10: via INTRAVENOUS

## 2017-09-10 MED ORDER — PANTOPRAZOLE SODIUM 40 MG PO TBEC
40.0000 mg | DELAYED_RELEASE_TABLET | Freq: Every day | ORAL | Status: DC
  Administered 2017-09-10: 40 mg via ORAL
  Filled 2017-09-10: qty 1

## 2017-09-10 MED ORDER — SODIUM CHLORIDE 0.9 % IV SOLN
1.5000 g | Freq: Once | INTRAVENOUS | Status: DC
  Filled 2017-09-10: qty 1.5

## 2017-09-10 MED ORDER — DOCUSATE SODIUM 100 MG PO CAPS
100.0000 mg | ORAL_CAPSULE | Freq: Every day | ORAL | Status: DC
  Administered 2017-09-11: 100 mg via ORAL
  Filled 2017-09-10: qty 1

## 2017-09-10 MED ORDER — PHENYLEPHRINE HCL 10 MG/ML IJ SOLN
INTRAMUSCULAR | Status: DC | PRN
  Administered 2017-09-10 (×2): 80 ug via INTRAVENOUS

## 2017-09-10 MED ORDER — SUGAMMADEX SODIUM 200 MG/2ML IV SOLN
INTRAVENOUS | Status: DC | PRN
  Administered 2017-09-10: 200 mg via INTRAVENOUS

## 2017-09-10 MED ORDER — EPHEDRINE SULFATE 50 MG/ML IJ SOLN
INTRAMUSCULAR | Status: DC | PRN
  Administered 2017-09-10: 5 mg via INTRAVENOUS
  Administered 2017-09-10: 15 mg via INTRAVENOUS
  Administered 2017-09-10: 10 mg via INTRAVENOUS

## 2017-09-10 MED ORDER — HYDROMORPHONE HCL 1 MG/ML IJ SOLN
0.2500 mg | INTRAMUSCULAR | Status: DC | PRN
  Administered 2017-09-10 (×2): 0.5 mg via INTRAVENOUS

## 2017-09-10 MED ORDER — ONDANSETRON HCL 4 MG/2ML IJ SOLN: 4.0000 mg | Freq: Four times a day (QID) | INTRAMUSCULAR | Status: DC | PRN

## 2017-09-10 MED ORDER — METOPROLOL TARTRATE 5 MG/5ML IV SOLN: 2.0000 mg | INTRAVENOUS | Status: DC | PRN

## 2017-09-10 MED ORDER — DEXAMETHASONE SODIUM PHOSPHATE 10 MG/ML IJ SOLN
INTRAMUSCULAR | Status: DC | PRN
  Administered 2017-09-10: 10 mg via INTRAVENOUS

## 2017-09-10 MED ORDER — MORPHINE SULFATE (PF) 2 MG/ML IV SOLN: 2.0000 mg | INTRAVENOUS | Status: DC | PRN

## 2017-09-10 MED ORDER — ONDANSETRON HCL 4 MG/2ML IJ SOLN
INTRAMUSCULAR | Status: DC | PRN
  Administered 2017-09-10: 4 mg via INTRAVENOUS

## 2017-09-10 MED ORDER — GUAIFENESIN-DM 100-10 MG/5ML PO SYRP: 15.0000 mL | ORAL_SOLUTION | ORAL | Status: DC | PRN

## 2017-09-10 MED ORDER — ALUM & MAG HYDROXIDE-SIMETH 200-200-20 MG/5ML PO SUSP: 15.0000 mL | ORAL | Status: DC | PRN

## 2017-09-10 MED ORDER — ROCURONIUM BROMIDE 100 MG/10ML IV SOLN
INTRAVENOUS | Status: DC | PRN
  Administered 2017-09-10 (×5): 20 mg via INTRAVENOUS
  Administered 2017-09-10: 50 mg via INTRAVENOUS

## 2017-09-10 MED ORDER — SUGAMMADEX SODIUM 200 MG/2ML IV SOLN
INTRAVENOUS | Status: AC
  Filled 2017-09-10: qty 2

## 2017-09-10 MED ORDER — PROPOFOL 10 MG/ML IV BOLUS
INTRAVENOUS | Status: DC | PRN
  Administered 2017-09-10: 140 mg via INTRAVENOUS

## 2017-09-10 MED ORDER — MEPERIDINE HCL 50 MG/ML IJ SOLN: 6.2500 mg | INTRAMUSCULAR | Status: DC | PRN

## 2017-09-10 SURGICAL SUPPLY — 92 items
BALLN ATLAS 14X40X75 (BALLOONS) ×4
BALLOON ATLAS 14X40X75 (BALLOONS) ×2 IMPLANT
CANISTER SUCT 3000ML PPV (MISCELLANEOUS) ×4 IMPLANT
CATH ANGIO 5F BER2 65CM (CATHETERS) IMPLANT
CATH BEACON 5.038 65CM KMP-01 (CATHETERS) ×4 IMPLANT
CATH OMNI FLUSH .035X70CM (CATHETERS) ×4 IMPLANT
CATH STRAIGHT 5FR 65CM (CATHETERS) ×4 IMPLANT
CATH VANSCH 5FR 6CM (CATHETERS) ×4 IMPLANT
CLIP VESOCCLUDE MED 24/CT (CLIP) ×4 IMPLANT
CLIP VESOCCLUDE SM WIDE 24/CT (CLIP) ×4 IMPLANT
COVER DOME SNAP 22 D (MISCELLANEOUS) ×8 IMPLANT
COVER PROBE W GEL 5X96 (DRAPES) ×4 IMPLANT
DERMABOND ADHESIVE PROPEN (GAUZE/BANDAGES/DRESSINGS) ×4
DERMABOND ADVANCED (GAUZE/BANDAGES/DRESSINGS) ×2
DERMABOND ADVANCED .7 DNX12 (GAUZE/BANDAGES/DRESSINGS) ×2 IMPLANT
DERMABOND ADVANCED .7 DNX6 (GAUZE/BANDAGES/DRESSINGS) ×4 IMPLANT
DEVICE CLOSURE PERCLS PRGLD 6F (VASCULAR PRODUCTS) ×8 IMPLANT
DEVICE ENSNARE  12MMX20MM (VASCULAR PRODUCTS) ×2
DEVICE ENSNARE 12MMX20MM (VASCULAR PRODUCTS) ×2 IMPLANT
DEVICE TORQUE KENDALL .025-038 (MISCELLANEOUS) ×4 IMPLANT
DRSG TEGADERM 2-3/8X2-3/4 SM (GAUZE/BANDAGES/DRESSINGS) ×4 IMPLANT
DRYSEAL FLEXSHEATH 12FR 45CM (SHEATH) ×4
DRYSEAL FLEXSHEATH 16FR 33CM (SHEATH) ×4
ELECT CAUTERY BLADE 6.4 (BLADE) ×4 IMPLANT
ELECT REM PT RETURN 9FT ADLT (ELECTROSURGICAL) ×8
ELECTRODE REM PT RTRN 9FT ADLT (ELECTROSURGICAL) ×4 IMPLANT
ENDOPRO ILIAC 23X10X10 FA (Endovascular Graft) ×8 IMPLANT
ENDOPROSTHESIS ILI 23X10X10 FA (Endovascular Graft) ×4 IMPLANT
EXCLUDER TNK 23X14.5MMX12CM (Endovascular Graft) ×2 IMPLANT
EXCLUDER TRUNK 23X14.5MMX12CM (Endovascular Graft) ×4 IMPLANT
GAUZE SPONGE 2X2 8PLY NS (GAUZE/BANDAGES/DRESSINGS) ×4 IMPLANT
GAUZE SPONGE 2X2 8PLY STRL LF (GAUZE/BANDAGES/DRESSINGS) ×2 IMPLANT
GAUZE SPONGE 4X4 16PLY XRAY LF (GAUZE/BANDAGES/DRESSINGS) ×4 IMPLANT
GLOVE BIO SURGEON STRL SZ7 (GLOVE) ×4 IMPLANT
GLOVE BIOGEL PI IND STRL 7.5 (GLOVE) ×2 IMPLANT
GLOVE BIOGEL PI INDICATOR 7.5 (GLOVE) ×2
GOWN STRL REUS W/ TWL LRG LVL3 (GOWN DISPOSABLE) ×6 IMPLANT
GOWN STRL REUS W/TWL LRG LVL3 (GOWN DISPOSABLE) ×6
GRAFT BALLN CATH 65CM (STENTS) ×2 IMPLANT
GUIDEWIRE ANGLED .035X150CM (WIRE) ×4 IMPLANT
GUIDEWIRE ANGLED .035X260CM (WIRE) ×4 IMPLANT
KIT BASIN OR (CUSTOM PROCEDURE TRAY) ×4 IMPLANT
KIT ENCORE 26 ADVANTAGE (KITS) ×4 IMPLANT
KIT ROOM TURNOVER OR (KITS) ×4 IMPLANT
LEG CONTRALATERAL 16X16X9.5 (Endovascular Graft) ×4 IMPLANT
LEG CONTRALATERAL 27X10 (Vascular Products) ×4 IMPLANT
LEG CONTRALATERAL 27X12 (Vascular Products) ×4 IMPLANT
LOOP VESSEL MAXI BLUE (MISCELLANEOUS) ×8 IMPLANT
LOOP VESSEL MINI RED (MISCELLANEOUS) ×4 IMPLANT
NEEDLE PERC 18GX7CM (NEEDLE) ×4 IMPLANT
NS IRRIG 1000ML POUR BTL (IV SOLUTION) ×4 IMPLANT
PACK ENDOVASCULAR (PACKS) ×4 IMPLANT
PAD ARMBOARD 7.5X6 YLW CONV (MISCELLANEOUS) ×8 IMPLANT
PENCIL BUTTON HOLSTER BLD 10FT (ELECTRODE) ×4 IMPLANT
PERCLOSE PROGLIDE 6F (VASCULAR PRODUCTS) ×16
PROTECTION STATION PRESSURIZED (MISCELLANEOUS) ×4
SHEATH AVANTI 11CM 8FR (MISCELLANEOUS) ×4 IMPLANT
SHEATH BRITE TIP 8FR 23CM (MISCELLANEOUS) ×4 IMPLANT
SHEATH DESTINATION 8F 45CM (SHEATH) ×4 IMPLANT
SHEATH DRYSEAL FLEX 12FR 45CM (SHEATH) ×4 IMPLANT
SHEATH DRYSEAL FLEX 16FR 33CM (SHEATH) ×4 IMPLANT
SHEATH PINNACLE 5F 10CM (SHEATH) ×4 IMPLANT
SHEATH PINNACLE 6F 10CM (SHEATH) ×4 IMPLANT
SPONGE GAUZE 2X2 STER 10/PKG (GAUZE/BANDAGES/DRESSINGS) ×2
STAPLER VISISTAT 35W (STAPLE) IMPLANT
STATION PROTECTION PRESSURIZED (MISCELLANEOUS) ×2 IMPLANT
STENT GRAFT BALLN CATH 65CM (STENTS) ×2
STENT VIABAHN 11X59X135 (Endovascular Graft) ×2 IMPLANT
STENT VIABAHN 11X59X135CM (Endovascular Graft) ×2 IMPLANT
STENT VIABAHN VBX 11X59X80 (STENTS) ×4 IMPLANT
STOPCOCK MORSE 400PSI 3WAY (MISCELLANEOUS) ×4 IMPLANT
SUT ETHILON 3 0 PS 1 (SUTURE) IMPLANT
SUT MNCRL AB 4-0 PS2 18 (SUTURE) ×8 IMPLANT
SUT PROLENE 5 0 C 1 24 (SUTURE) ×12 IMPLANT
SUT PROLENE 6 0 BV (SUTURE) ×4 IMPLANT
SUT SILK 2 0 (SUTURE) ×2
SUT SILK 2 0 PERMA HAND 18 BK (SUTURE) ×4 IMPLANT
SUT SILK 2-0 18XBRD TIE 12 (SUTURE) ×2 IMPLANT
SUT SILK 3 0 (SUTURE) ×2
SUT SILK 3-0 18XBRD TIE 12 (SUTURE) ×2 IMPLANT
SUT SILK 4 0 (SUTURE) ×2
SUT SILK 4-0 18XBRD TIE 12 (SUTURE) ×2 IMPLANT
SUT VIC AB 2-0 CTX 36 (SUTURE) ×4 IMPLANT
SUT VIC AB 3-0 SH 27 (SUTURE) ×2
SUT VIC AB 3-0 SH 27X BRD (SUTURE) ×2 IMPLANT
SYR 30ML LL (SYRINGE) ×4 IMPLANT
TOWEL GREEN STERILE (TOWEL DISPOSABLE) ×4 IMPLANT
TRAY FOLEY MTR SLVR 16FR STAT (CATHETERS) ×4 IMPLANT
TUBING HIGH PRESSURE 120CM (CONNECTOR) ×4 IMPLANT
WIRE AMPLATZ SS-J .035X180CM (WIRE) ×8 IMPLANT
WIRE BENTSON .035X145CM (WIRE) ×12 IMPLANT
WIRE ROSEN-J .035X260CM (WIRE) ×4 IMPLANT

## 2017-09-10 NOTE — Transfer of Care (Signed)
Immediate Anesthesia Transfer of Care Note  Patient: Nathaniel Kirk  Procedure(s) Performed: ABDOMINAL AORTIC ENDOVASCULAR STENT GRAFT WITH RIGHT AND  LEFT ILIAC BRANCH EXTENSIONS (N/A Abdomen)  Patient Location: PACU  Anesthesia Type:General  Level of Consciousness: awake, alert  and patient cooperative  Airway & Oxygen Therapy: Patient Spontanous Breathing and Patient connected to nasal cannula oxygen  Post-op Assessment: Report given to RN and Post -op Vital signs reviewed and stable  Post vital signs: Reviewed and stable  Last Vitals:  Vitals:   09/10/17 0603 09/10/17 0604  BP: 126/87   Pulse: 72   Resp: 18 20  Temp: 36.6 C   SpO2: 97%     Last Pain:  Vitals:   09/10/17 0603  TempSrc: Oral         Complications: No apparent anesthesia complications

## 2017-09-10 NOTE — Anesthesia Postprocedure Evaluation (Signed)
Anesthesia Post Note  Patient: Nathaniel Kirk  Procedure(s) Performed: ABDOMINAL AORTIC ENDOVASCULAR STENT GRAFT WITH RIGHT AND  LEFT ILIAC BRANCH EXTENSIONS (N/A Abdomen)     Patient location during evaluation: PACU Anesthesia Type: General Level of consciousness: awake and alert Pain management: pain level controlled Vital Signs Assessment: post-procedure vital signs reviewed and stable Respiratory status: spontaneous breathing, nonlabored ventilation, respiratory function stable and patient connected to nasal cannula oxygen Cardiovascular status: blood pressure returned to baseline and stable Postop Assessment: no apparent nausea or vomiting Anesthetic complications: no    Last Vitals:  Vitals:   09/10/17 1415 09/10/17 1451  BP:  125/73  Pulse:    Resp:    Temp: (!) 36.3 C 36.6 C  SpO2:      Last Pain:  Vitals:   09/10/17 1451  TempSrc: Oral  PainSc: 0-No pain                 Debbie Yearick DAVID

## 2017-09-10 NOTE — Plan of Care (Signed)
  Progressing Education: Knowledge of discharge needs will improve 09/10/2017 2111 - Progressing by Drenda Freeze, RN Clinical Measurements: Postoperative complications will be avoided or minimized 09/10/2017 2111 - Progressing by Drenda Freeze, RN Respiratory: Ability to achieve and maintain a regular respiratory rate will improve 09/10/2017 2111 - Progressing by Drenda Freeze, RN

## 2017-09-10 NOTE — Op Note (Addendum)
OPERATIVE NOTE   PROCEDURE: 1. Right common femoral artery cannulation under ultrasound guidance 2. "Preclose" repair of right common femoral artery, i.e. proglide placement x 2 3. Aortogram 4. Endovascular aortic repair with main body with attached limb (Gore C3 23 mm x 14 mm x 12 cm) 5. Selection of right internal iliac artery second order branch 6. Placement of right iliac branch device (Gore 23 cm x 10 mm x 10 cm) 7. Stenting and angioplasty of right internal iliac artery stent (VBX 11 mm x 59) 8. Selection of left internal iliac artery second order branch 9. Placement of left iliac branch device (Gore 23 cm x 10 mm x 10 cm) 10. Stenting and angioplasty of left internal iliac artery stent (VBX 11 mm x 59) 11. Placement of right iliac bridging limb (27 mm x 12 cm) 12. Placement of right iliac limb extension (16 mm x 9.5 cm)  (Dictated by Dr. Scot Dock) 2. Left femoral artery open exposure and repair 14. Placement of left iliac bridging limb (27 mm x 10 cm)  PRE-OPERATIVE DIAGNOSIS: moderate sized abdominal aortic aneurysm, large bilateral common iliac artery aneurysms, small left internal iliac artery aneurysm   POST-OPERATIVE DIAGNOSIS: same as above   CO-SURGEONS:  Adele Barthel, MD, Gae Gallop, MD  ANESTHESIA: general  ESTIMATED BLOOD LOSS: 100 cc  FINDING(S): 1.  Successful exclusion of aneurysm at end of case  2.  Endoleaks: none 3.  Bilateral patent renal arteries and internal iliac arteries 4.  Distal pedal signals: bilateral dopplerable dorsalis pedis artery signals  SPECIMEN(S):  none  INDICATIONS:   Nathaniel Kirk is a 72 y.o. ([birth year]) male who presents with moderate sized abdominal aortic aneurysm, large bilateral common iliac artery aneurysms and small left internal iliac artery aneurysms.  This patient also has bladder cancer in the setting of prior sigmoid colectomy.  I recommended endovascular aortic repair with possible bilateral iliac  branch device placement to maintain perfusion to the pelvis given the possible need for bladder resection.  The patient is aware the risks of endovascular aortic surgery include but are not limited to: bleeding, need for transfusion, infection, death, stroke, paralysis, wound complications, spinal, pelvic and bowel ischemia, extended ventilation, anaphylactic reaction to contrast, contrast induced nephropathy, embolism, and need for additional procedure to address endoleaks.  I also discussed with the patient the risks of pelvic ischemia in the event embolization of an internal iliac artery was necessary.  Overall, I cited a mortality rate of 1-2% and morbidity rate of 15%.  The patient is aware of the risks and agree to proceed.   DESCRIPTION: After obtaining full informed written consent, the patient was brought back to the operating room and placed supine upon the operating table.  The patient received IV antibiotics prior to induction.  A procedure time out was completed and the correct surgical site was verified.  After obtaining adequate anesthesia, the patient was prepped and draped in the standard fashion for: open or endovascular aortic procedure.  A two surgeon technique was utilized to complete the iliac branch device placement on both sides and expedite the endograft placement.  This procedure cannot be performed without two experienced surgeons due to the technical complexity of iliac branch device placement.    Contralateral Access I turned my attention to the right groin  Under ultrasound guidance,  the common femoral artery was then cannulated with a 18 gauge needle.  The Bentson wire was passed up into the aorta.  The needle was exchanged for  a 8-Fr dilator, which was used to dilate the subcutaneous tract and arterial puncture.  The dilator was exchanged for a Proglide device.  This was deployed at 30 degrees medial rotation.  The Proglide sutures were tagged and then the Bentson wire was  reloaded in the used Proglide device.  The used device was exchanged for a new Proglide device.  The new device was deployed at 30 degrees lateral rotation.  The new Proglide sutures were tagged and then the Bentson wire was reloaded in the Proglide device.  The device was exchanged for a short 8-Fr sheath.  In this fashion, the "Preclose" technique repair of the common femoral artery was completed.    Ipsilateral Access (Main Body) Dr. Scot Dock turned his attention to the left groin.  He started the same Preclose technique.  Upon attempting to replace the wire into the used Proglide device, the wire caused the device to kick out of the lumen of the left common femoral artery.  Despite attempts, luminal access could not be easily obtained.  To limit bleeding, I held pressure while Dr. Scot Dock engaged the just placed Proglide device.  Dr. Scot Dock performed a left femoral artery exposure, see his notes for details.  He then cannulated the left common femoral artery in an open fashion with a 18 gauge needle.  A Bentson wire was advanced through the needle but curled in the distal external iliac artery.  This repeated with a Glidewire and also with a KMP catheter over both wires.  At this point, I had a suspicion there was dissection in the left external iliac artery.  Hand injections confirmed that there was a non-flow limiting dissection in the distal external iliac artery but none in the common femoral artery.  A 6-Fr sheath was placed in the left common femoral artery, taking care to keep the sheath only in the common femoral artery where true lumen was cannulated.  Navigation of Left External Iliac Artery Dissection At this point, I used a KMP catheter with the Bentson wire on the right side to get into the suprarenal segment.  I exchanged the wire for an Amplatz wire.  The right sheath was exchanged for a 16-Fr Dryseal sheath which was advanced into the distal aorta.  I placed a Glidewire in the right sheath  with a buddy wire technique and then loaded a Omniflush catheter, I used this combination to select the left common iliac artery.  I was able to eventually get the wire into the left internal iliac artery.  The cathter was exchanged for a KMP catheter.  Using this combination, I steered into the left common femoral artery where there was no dissection.  Via the left femoral sheath, a prepared tulip snare was placed in the left common femoral artery.  Dr. Scot Dock was able to capture my wire.  He pulled the Glidewire out the left femoral sheath.  We then pulled the wire until the two lengths were equivalent.  In this fashion, we got true lumen access on the left side.  This Glidewire was used as the thru-thru wire for the iliac branch device placement.  Heparinization The patient was given 12000 units of Heparin intravenously, which was a therapeutic bolus. An additional 2000 units of Heparin was administered every hour after initial bolus to maintain anticoagulation.  In total, 18000 units of Heparin was administrated to achieve and maintain a therapeutic level of anticoagulation.  Placement of Right Iliac Branch Device At this point, a 23 cm x 10  mm x 10 cm iliac branch device was loaded over the right side of the thru-thru wire and Amplatz wire.  This device advanced into the proximal common iliac artery.  Using the buddy wire technique, Dr. Scot Dock placed a Bentson wire in the left sheath.  A pigtail catheter was placed over the wire into the distal aorta.  The wire was removed and the catheter connected to the power injector circuit.  A single power injection demonstrated the anatomy of the right iliac arterial system.  The right iliac branch device was repositioned proximal to the right internal iliac artery.  I deployed the IBD with the contralateral gate pointing toward the right internal iliac artery.  Over the thru-thru wire, Dr. Scot Dock loaded the 12-Fr Dryseal Flexsheath and advanced the sheath into  the right iliac branch device.  Dr. Scot Dock the placed a Glidewire via a Buddy wire technique through the left 12-Fr sheath.  He was able to easily select the right internal iliac artery.  Using a KMP catheter, he was able to select a second-order branch in the right internal iliac artery system.  The wire was exchanged for a Rosen wire.  The catheter was exchanged for a 11 mm x 59 mm VBX covered stent.  With some difficulty, the stent was advanced into the right internal iliac artery with adequate overlap in the IBD contralateral gate.  The balloon expandable stent was deployed at 6 ATM.  The stent delivery device was removed and exchanged for a 14 mm x 60 mm Atlas with difficulty due to the sheath kicking out of the iliac branch device.  Eventually using a balloon to avoid the catching the edge of the iliac branch device, I was able to get the left 12-Fr sheath back into the iliac branch device.  The Atlas balloon was centered on the stent and inflated to 10 ATM to post-dilate the stent and mold the stent to the contralateral limb of the iliac branch device.  The balloon was deflated and removed.  I pulled the right sheath distal to the remaining constrained portion of the right iliac branch device.  The constrained limb extending into the right external iliac artery was released.  I removed the endograft delivery device.  The thru-thru wire was removed.  Placement of Left Iliac Branch Device Dr. Scot Dock pulled the 12-Fr sheath back into the distal aorta.  The Rosen wire was pulled back into the distal aorta and repositioned into the suprarenal aorta.  Using KMP catheter, he exchanged the wire for an Amplatz wire.  The left sheath was exchanged for a 16-Fr Dryseal sheath.  Using a buddy wire technique, Dr. Scot Dock placed a long Glidewire into the distal aorta.  I then deployed a tulip snare through the right sheath into the distal aorta and captured the Glidewire and pulled it through the 16-Fr sheath.  This  became the new thru-and-thru wire.  I pulled the wire until the two external lengths were equal.  At this point, a 23 cm x 10 mm x 10 cm iliac branch device was loaded into the left sheath over the left side of the thru-thru wire and Amplatz wire.  This device was advanced into the proximal left common iliac artery.  Using a buddy wire technique, I placed a Bentson wire in the right sheath.  A pigtail catheter was loaded over this wire into the distal aorta.  The wire was recommend and the power injector was connected to the catheter.  A single power  injection demonstrated the anatomy of the left iliac arterial system.  The left iliac branch device was repositioned proximal to the left internal iliac artery.  I deployed the device with the contralateral gate pointing toward the left internal iliac artery.  Over the thru-thru wire, I loaded the 12-Fr Dryseal Flexsheath through the right 16-Fr sheath and advanced the sheath into the left iliac branch device.  I then then placed a Glidewire via a Buddy wire technique through the right 12-Fr sheath.  I was able to easily select the left internal iliac artery.  Using a KMP catheter, I was able to select a second-order branch in the left internal iliac artery system.  The wire was exchanged for a Rosen wire.  The catheter was exchanged for a 11 mm x 59 mm VBX covered stent.  With some difficulty, the stent was advanced into the left internal iliac artery with adequate overlap in the IBD's contralateral gate.  The balloon expandable stent was deployed at 6 ATM.  The stent delivery device was removed and exchanged for a 14 mm x 60 mm Atlas with some difficulty due to the sheath backing out of the iliac branch device.  The Atlas balloon was centered on the stent and inflated to 10 ATM to post-dilate the stent and mold the stent to the contralateral limb of the iliac branch device.  The balloon was deflated and removed.  I pulled the left sheath distal to the remaining  constrained portion of the iliac iliac branch device.  The constrained limb extending into the right external iliac artery was released.  I removed the endograft delivery device.  The thru-thru wire was removed. I pulled the 12-Fr right sheath back into the distal aorta prior to removing this sheath.   Placement of Main Body of Endograft and Initial Aortogram At this point, I loaded a pigtail catheter over the right Amplatz wire, advancing into the suprarenal segment.  The wire was removed and the catheter connected to the power injector.  The main body of the endograft (C3 23 mm x 14 mm x 12 cm) was loaded over the left wire into the presumed infrarenal position.  A power injector aortogram was completed at appropriate angulation to evaluate the takeoff of the renal arteries.    Dr. Scot Dock adjusted the position of the main body and held position while I deployed the main body at the level of the right renal artery which the lowest.  A completion aortogram was completed which demonstrated: intact bilateral renal artery perfusion and deployment just distal to the right renal artery.    Contralateral Gate Cannnulation At this point, I straightened out the pigtail catheter with a Bentson wire and removed the wire and catheter.  I loaded a KMP catheter and glidewire into the right sheath.  I had difficulty cannulating the contralateral gate.  I tried multiple catheters and wire.  Eventually, I elected to reconstrain the main body and rotate the body.  This was completed without any significant change in main body position.  The main body was allow to open again.  This maneuver appeared to pop the contralateral gate further open.  I was then able to cannulate the contralateral gate using a KMP catheter and a Glide wire.  I advanced the wire and catheter into the main body without difficulty.  The wire was exchanged for the Amplatz wire.  I exchanged the catheter for a pigtail catheter.  I pulled the Amplatz  wire back and allowed the  crook of the catheter to reform.  I spun the formed catheter at the top of the main body, demonstrating intragraft location.  I advanced the catheter into the suprarenal position and connected to the power injector circuit.  I did another aortogram to verify infra-renal position of the main body and also to measure the lengths between the main body and the iliac branch devices.    Placement of Bridging Limbs Based on the images, I selected a right iliac bridging limb, 27 mm x 12 cm.  After placement in the body, it became apparent that the limb was inadequately long so I elected to place the bell-bottom limb with adequate overlap into the right iliac branch device, leaving a small gap between the contralateral limb and the bridging limb.  I removed the stent delivery device.  I then loaded the iliac limb extension, 16 mm x 9.5 cm, centering the limb on the gap.  I deployed the device and removed the stent delivery device.  At this point, Dr. Scot Dock fully deployed the left constrained limb on the main body.  He loaded a left iliac bridging limb (27 mm x 10 cm) and placing in with adequate overlap proximally in the ipsilateral iliac and limb and distally into the left iliac branch device, see his notes for details.  The stent delivery device was removed.    Aortic and Limb Molding The MOB (molding and occlusion balloon) was loaded on the right wire and advanced into the main body.  I molded the proximal main body, entirety of the right iliac limbs distal to the main body flow divider and down to the the iliac branch device proximal to its flow divider.  Finally the external iliac artery segment off the right iliac branch device was molded with the balloon.  The balloon was deflated and placed on the left wire.  I inflated the balloon along the overlapping segment and the left iliac branch device proximal to its flow divider.  Finally, the left external iliac artery segment of the  iliac branch device was molded.  The balloon was deflated and removed.  Completion Aortogram The pigtail catheter was replaced on the right wire and advanced into the suprarenal aorta.  The wire was removed and the catheter was connected to the power injector circuit.  A power injector aortogram was completed: no endoleak was evident with intact bilateral renal artery flow and bilateral internal iliac artery flow.  I also completed a retrograde injection on the left sheath, which had been backed down to image the distal external iliac artery dissection.  This injection demonstrated no residual dissection as the external iliac artery segment of the left iliac branch device essential extended completely over the dissected segment.  I replaced the Bentson wire into the catheter, straightening out the crook in the catheter.  I removed the catheter.    Completion of Pre-close Repair of Right Common Femoral Artery At this point, I gave 60 mg of Protamine to reverse anticoagulation.  I turned my attention to the right groin.  Dr. Scot Dock held pressure proximally and distally on this common femoral artery as I removed the femoral sheath.  There was no change in blood pressure with this maneuver.  I then sequentially tightened the previously tagged Proglide sutures.  There was minimal bleeding with the wire still in place, so I removed the wire.  I sequentially tightened the previous tagged Proglide sutures again and locked each set of sutures by applying tension to the locking  stitch in each pair.  In this fashion, I repaired the right common femoral artery.  A hemostat was applied to the sutures in the right groin.    After waiting a few minutes, I transected the sutures in the right groin.  The skin was cleaned, dried, and reinforced with Dermabond.  A sterile dressing was applied.  Repair of Left Common Femoral Artery  At this point, Dr. Scot Dock repaired the left common femoral artery which had been exposed  surgically.  See his notes for details.  Sterile dressings were applied to all incisions.  Completion Distal Pulse I then interrogated the feet.  Both appeared well perfused and warm.  Distally, I could doppler on the right: dopplerable dorsalis pedis artery, and on the left: dopperable dorsalis pedis artery.   Adele Barthel, MD, FACS [facility] Office: [phone] Pager: [phone]  09/10/2017, 12:44 PM

## 2017-09-10 NOTE — Anesthesia Procedure Notes (Signed)
Arterial Line Insertion Start/End2/18/2019 7:00 AM Performed by: White, Amedeo Plenty, Immunologist, CRNA  Preanesthetic checklist: patient identified, IV checked, risks and benefits discussed, surgical consent, pre-op evaluation, timeout performed and anesthesia consent Lidocaine 1% used for infiltration Left, radial was placed Catheter size: 20 G Hand hygiene performed  and maximum sterile barriers used  Allen's test indicative of satisfactory collateral circulation Attempts: 1 Procedure performed without using ultrasound guided technique. Following insertion, Biopatch and dressing applied. Post procedure assessment: normal  Patient tolerated the procedure well with no immediate complications. Additional procedure comments: Performed by H. Rock Nephew, SRNA.

## 2017-09-10 NOTE — Op Note (Signed)
NAME: Nathaniel Kirk    MRN: [id number] DOB: 02/24/1946    DATE OF OPERATION: 09/10/2017  PREOP DIAGNOSIS:    Abdominal aortic aneurysm and left common iliac artery aneurysm  POSTOP DIAGNOSIS:    Same  PROCEDURE:    Exposure of left common femoral artery Repair of left common femoral artery Placement of 27 mm by 10 cm iliac bridge   CO-SURGEON's: Judeth Cornfield. Scot Dock, MD, Adele Barthel, MD  ANESTHESIA: General  EBL: Per anesthesia records  INDICATIONS:    Nathaniel Kirk is a 72 y.o. male who presents for repair of a large abdominal aortic aneurysm and left common iliac artery aneurysm.  FINDINGS:   Completion film showed the graft in excellent position lymph no type I or type II endoleak is noted.  TECHNIQUE:   The patient was taken to the operating room and received a general anesthetic.  The abdomen and groins were prepped and draped in usual sterile fashion. Given the complexity of the case, and the patients comorbidities, a two team approach was used in order to expedite the procedure.   The majority of the dictation is as per Dr. Bridgett Larsson.  On the left side, under ultrasound guidance, I cannulated the left common femoral artery and a guidewire was introduced.  Given the tortuosity of the external iliac artery was difficult to pass the wire.  We did advance the wire using the assistance of the dilator far enough to attempt the Perclose device.  The initial Perclose device was placed and rotated 15 degrees medially.  This suture was secured.  The wire was then advanced again but again we had difficulty passing the wire.  We tried using a Kumpe catheter and Glidewire but ultimately were concerned that we might have a section of the external iliac artery.  This reason it was felt that the safest approach was to remove the wire, secured the Perclose device and then do a femoral artery exposure.  Once the wire was removed and the Perclose was secured an oblique incision was made in  the left groin and this was carried down to the common femoral artery where the cannulation site was identified in the distal lateral common femoral artery near the deep femoral artery.  There was some bleeding in this area that was controlled with a 5-0 Prolene.  I then exposed the common femoral artery and controlled this with a vessel loop.  The superficial femoral and deep femoral arteries were also controlled and then cannulated the common femoral artery higher up and a guidewire was introduced.  We were able to get the wire up into the infrarenal aorta and exchanged ultimately for an Amplatz wire using a straight catheter.  After the iliac branch devices had been placed as dictated by Dr. Ronda Fairly, from the left side we did place a bridge from the trunk ipsilateral component to the left iliac branch device using a 27 mm x 10 cm bridge.  The Amplatz wire was in place into the trunk ipsilateral component and the bridge piece was positioned with overlap proximally and distally in the deployed without difficulty  At the completion of the procedure, the sheath was removed and the common femoral, superficial femoral, and deep femoral arteries clamped.  The hole in the artery was repaired with 2 5-0 Prolene sutures.  At the completion there was a good Doppler signal in the superficial femoral artery deep femoral artery and common femoral artery.  Cannulation site on the lateral aspect adjacent to  the deep femoral artery repaired also with secondary to some oozing with a 6-0 Prolene.  Hemostasis was obtained in the wound the wound was closed with a deep layer of 2-0 Vicryl, a subcutaneous layer with 3-0 Vicryl and the skin closed with a 4-0 Monocryl.  Sterile dressing was applied.  Patient tolerated the procedure well was transferred to the recovery room in stable condition.  All needle sponge counts were correct.   [name], MD, FACS [facility]  DATE OF  DICTATION:   09/10/2017

## 2017-09-10 NOTE — Anesthesia Procedure Notes (Addendum)
Procedure Name: Intubation Date/Time: 09/10/2017 7:44 AM Performed by: White, Amedeo Plenty, CRNA Pre-anesthesia Checklist: Patient identified, Emergency Drugs available, Suction available and Patient being monitored Patient Re-evaluated:Patient Re-evaluated prior to induction Oxygen Delivery Method: Circle System Utilized Preoxygenation: Pre-oxygenation with 100% oxygen Induction Type: IV induction Ventilation: Mask ventilation without difficulty Laryngoscope Size: Mac and 3 Grade View: Grade I Tube type: Oral Tube size: 7.5 mm Number of attempts: 1 Airway Equipment and Method: Stylet Placement Confirmation: ETT inserted through vocal cords under direct vision,  positive ETCO2 and breath sounds checked- equal and bilateral Secured at: 22 cm Tube secured with: Tape Dental Injury: Teeth and Oropharynx as per pre-operative assessment  Comments: Intubation by H. Rock Nephew, [place]

## 2017-09-10 NOTE — Progress Notes (Signed)
  Day of Surgery Note    Subjective:  Only complaint is he feels like something is in his eye since surgery   Vitals:   09/10/17 1415 09/10/17 1451  BP:  125/73  Pulse:    Resp:    Temp: (!) 97.4 F (36.3 C) 97.9 F (36.6 C)  SpO2:      Incisions:   Bilateral groins are soft without hematoma; left groin incision is clean and dry Extremities:  Easily palpable DP pulses bilaterally Cardiac:  regular Lungs:  Non labored Abdomen:  Soft, NT/ND Eyes:  Left eye without any obvious foreign object or abrasion   Assessment/Plan:  This is a 72 y.o. male who is s/p  EVAR  -pt resting comfortably in his room -easily palpable DP pulses bilaterally -left eye without any obvious foreign object or abrasion-discussed with Dr. Bridgett Larsson and will order saline gtts.  Continue warm compresses. -left groin incision and right groin are soft without hematoma   Leontine Locket, PA-C 09/10/2017 3:35 PM [phone]

## 2017-09-10 NOTE — Interval H&P Note (Signed)
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 After consideration of risks, benefits and other options for treatment, the patient has consented to  Procedure(s): ABDOMINAL AORTIC ENDOVASCULAR STENT GRAFT WITH RIGHT AND POSSIBLE LEFT ILIAC BRANCH EXTENSIONS (N/A) EMBOLIZATION LEFT INTERNAL ILIAC ARTERY (N/A) as a surgical intervention .  The patient's history has been reviewed, patient examined, no change in status, stable for surgery.  I have reviewed the patient's chart and labs.  Questions were answered to the patient's satisfaction.     Adele Barthel

## 2017-09-10 NOTE — Anesthesia Preprocedure Evaluation (Signed)
Anesthesia Evaluation  Patient identified by MRN, date of birth, ID band Patient awake    Reviewed: Allergy & Precautions, NPO status , Patient's Chart, lab work & pertinent test results  Airway Mallampati: I  TM Distance: >3 FB Neck ROM: Full    Dental   Pulmonary Current Smoker,    Pulmonary exam normal        Cardiovascular Normal cardiovascular exam     Neuro/Psych    GI/Hepatic   Endo/Other    Renal/GU      Musculoskeletal   Abdominal   Peds  Hematology   Anesthesia Other Findings   Reproductive/Obstetrics                             Anesthesia Physical Anesthesia Plan  ASA: III  Anesthesia Plan: General   Post-op Pain Management:    Induction: Intravenous  PONV Risk Score and Plan: 1 and Treatment may vary due to age or medical condition  Airway Management Planned: Oral ETT  Additional Equipment: Arterial line  Intra-op Plan:   Post-operative Plan: Extubation in OR  Informed Consent: I have reviewed the patients History and Physical, chart, labs and discussed the procedure including the risks, benefits and alternatives for the proposed anesthesia with the patient or authorized representative who has indicated his/her understanding and acceptance.     Plan Discussed with: CRNA and Surgeon  Anesthesia Plan Comments:         Anesthesia Quick Evaluation

## 2017-09-11 ENCOUNTER — Other Ambulatory Visit: Payer: Self-pay

## 2017-09-11 LAB — BASIC METABOLIC PANEL
Anion gap: 9 (ref 5–15)
BUN: 11 mg/dL (ref 6–20)
CALCIUM: 8.7 mg/dL — AB (ref 8.9–10.3)
CO2: 23 mmol/L (ref 22–32)
CREATININE: 0.94 mg/dL (ref 0.61–1.24)
Chloride: 104 mmol/L (ref 101–111)
GFR calc non Af Amer: >60 mL/min (ref >60)
Glucose, Bld: 118 mg/dL — ABNORMAL HIGH (ref 65–99)
Potassium: 4.5 mmol/L (ref 3.5–5.1)
SODIUM: 136 mmol/L (ref 135–145)

## 2017-09-11 LAB — CBC
HEMATOCRIT: 40.3 % (ref 39.0–52.0)
Hemoglobin: 13.4 g/dL (ref 13.0–17.0)
MCH: 31.2 pg (ref 26.0–34.0)
MCHC: 33.3 g/dL (ref 30.0–36.0)
MCV: 93.9 fL (ref 78.0–100.0)
Platelets: 177 10*3/uL (ref 150–400)
RBC: 4.29 MIL/uL (ref 4.22–5.81)
RDW: 13.5 % (ref 11.5–15.5)
WBC: 15 10*3/uL — ABNORMAL HIGH (ref 4.0–10.5)

## 2017-09-11 MED ORDER — OXYCODONE-ACETAMINOPHEN 5-325 MG PO TABS: 1.0000 | ORAL_TABLET | ORAL | 0 refills | Status: DC | PRN

## 2017-09-11 NOTE — Care Management Note (Signed)
Case Management Note Marvetta Gibbons RN, BSN Unit 4E-Case Manager [phone]  Patient Details  Name: Nathaniel Kirk MRN: [id number] Date of Birth: January 03, 1946  Subjective/Objective:   Pt admitted s/p AAA stent repair                 Action/Plan: PTA pt lived at home- plan to d/c home today, no CM needs noted for transition home  Expected Discharge Date:  09/11/17               Expected Discharge Plan:  Home/Self Care  In-House Referral:  NA  Discharge planning Services  CM Consult, NA  Post Acute Care Choice:  NA Choice offered to:     DME Arranged:    DME Agency:     HH Arranged:    [place] Agency:     Status of Service:  Completed, signed off  If discussed at [place] of Stay Meetings, dates discussed:    Discharge Disposition: home/self care   Additional Comments:  Dawayne Patricia, RN 09/11/2017, 10:04 AM

## 2017-09-11 NOTE — Progress Notes (Signed)
Ambulated patient in hallway 242ft. Tolerated well. Returned to [employer]. Call light in reach. Will continue to monitor.  Clyde Canterbury, RN

## 2017-09-11 NOTE — Progress Notes (Addendum)
    Subjective  - Doing well over all, no complaints   Objective 106/64 81 98 F (36.7 C) (Oral) 19 93%  Intake/Output Summary (Last 24 hours) at 09/11/2017 0716 Last data filed at 09/11/2017 1103 Gross per 24 hour  Intake 2970 ml  Output 2155 ml  Net 815 ml    Palpable DP B Left groin incision healing well without hematoma  Right groin soft without hematoma Abdomen soft, NTTP Heart RRR    Assessment/Planning: POD # 1 EVAR  Groins soft Palpable DP B Voided and tolerating PO's Plan to discharge home today f/u in 4 weeks with CTA Abd/pelvis   Roxy Horseman 09/11/2017 7:16 AM --  Laboratory Lab Results: Recent Labs    09/10/17 1550 09/11/17 0210  WBC 8.9 15.0*  HGB 11.0* 13.4  HCT 32.7* 40.3  PLT 137* 177   BMET Recent Labs    09/10/17 1550 09/11/17 0210  NA  --  136  K  --  4.5  CL  --  104  CO2  --  23  GLUCOSE  --  118*  BUN  --  11  CREATININE 0.69 0.94  CALCIUM  --  8.7*    COAG Lab Results  Component Value Date   INR 1.06 09/06/2017   No results found for: PTT  Addendum  I have independently interviewed and examined the patient, and I agree with the physician assistant's findings.  Mild amt of L groin echymosis with inc c/d/i, warm feet with faintly palpable DP.  Pt has already voided after foley placement.  - Ok to D/C home - F/U in 4 weeks with CTA abd/pelvis   Adele Barthel, MD, FACS [facility]: [phone] Pager: [phone]  09/11/2017, 9:21 AM

## 2017-09-11 NOTE — Progress Notes (Signed)
D/c instructions given to patient, and prescriptions. Iv removed, clean and intact. Telemetry removed. Education given on caring for incisions. Daughter to escort pt home.   Clyde Canterbury, RN

## 2017-09-11 NOTE — Discharge Instructions (Signed)
   [facility]   Discharge Instructions  Endovascular Aortic Aneurysm Repair  Please refer to the following instructions for your post-procedure care. Your surgeon or Physician Assistant will discuss any changes with you.  Activity  You are encouraged to walk as much as you can. You can slowly return to normal activities but must avoid strenuous activity and heavy lifting until your doctor tells you it's OK. Avoid activities such as vacuuming or swinging a gold club. It is normal to feel tired for several weeks after your surgery. Do not drive until your doctor gives the OK and you are no longer taking prescription pain medications. It is also normal to have difficulty with sleep habits, eating, and bowel movements after surgery. These will go away with time.  Bathing/Showering  You may shower after you go home. If you have an incision, do not soak in a bathtub, hot tub, or swim until the incision heals completely.  Incision Care  Shower every day. Clean your incision with mild soap and water. Pat the area dry with a clean towel. You do not need a bandage unless otherwise instructed. Do not apply any ointments or creams to your incision. If you clothing is irritating, you may cover your incision with a dry gauze pad.  Diet  Resume your normal diet. There are no special food restrictions following this procedure. A low fat/low cholesterol diet is recommended for all patients with vascular disease. In order to heal from your surgery, it is CRITICAL to get adequate nutrition. Your body requires vitamins, minerals, and protein. Vegetables are the best source of vitamins and minerals. Vegetables also provide the perfect balance of protein. Processed food has little nutritional value, so try to avoid this.  Medications  Resume taking all of your medications unless your doctor or nurse practitioner tells you not to. If your incision is causing pain, you may take  over-the-counter pain relievers such as acetaminophen (Tylenol). If you were prescribed a stronger pain medication, please be aware these medications can cause nausea and constipation. Prevent nausea by taking the medication with a snack or meal. Avoid constipation by drinking plenty of fluids and eating foods with a high amount of fiber, such as fruits, vegetables, and grains. Do not take Tylenol if you are taking prescription pain medications.   Follow up  Our office will schedule a follow-up appointment with a C.T. scan 3-4 weeks after your surgery.  Please call us immediately for any of the following conditions  Severe or worsening pain in your legs or feet or in your abdomen back or chest. Increased pain, redness, drainage (pus) from your incision sit. Increased abdominal pain, bloating, nausea, vomiting or persistent diarrhea. Fever of 101 degrees or higher. Swelling in your leg (s),  Reduce your risk of vascular disease  Stop smoking. If you would like help call [facility] at 1-800-QUIT-NOW (1-800-784-8669) or [place] at [phone]. Manage your cholesterol Maintain a desired weight Control your diabetes Keep your blood pressure down  If you have questions, please call the office at [phone].   

## 2017-09-12 ENCOUNTER — Other Ambulatory Visit: Payer: Self-pay

## 2017-09-12 DIAGNOSIS — Z48812 Encounter for surgical aftercare following surgery on the circulatory system: Secondary | ICD-10-CM

## 2017-09-12 DIAGNOSIS — I714 Abdominal aortic aneurysm, without rupture, unspecified: Secondary | ICD-10-CM

## 2017-09-12 NOTE — Discharge Summary (Signed)
[facility] Discharge Summary   Patient ID:  Nathaniel Kirk MRN: [id number] DOB/AGE: [birth year] 72 y.o.  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 Arthritis    knees  . Bladder tumor    removed 08/31/17  post op bleeding   . 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 surgery scheduled with Dr. Bridgett Larsson 09-10-17  . Pneumonia    walking PNA  2016 or 2017  . UTI (urinary tract infection)     Procedure(s): ABDOMINAL AORTIC ENDOVASCULAR STENT GRAFT WITH RIGHT AND  LEFT ILIAC BRANCH EXTENSIONS  Discharged Condition: good  HPI:  72 y/o male with AAA with iliac aneursyms incidentally found during a work up of blood found in his urine.  He was first seen in our office 07/27/2017.  At that time the aneurysms were measured at  AAA 5.4 cm x 5.0 cm.  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  The CT scan which demonstrated a anterior bladder tumor that represents bladder cancer.  The 2 will be treated surgically separately.  He under went bladder tumor resection first on 08/31/2017.  He was then scheduled for EVAR repair of the aneurysms on 09/10/2017 by Dr. Bridgett Larsson and Dr. Scot Dock.  Hospital Course:  Nathaniel Kirk is a 72 y.o. male is S/PProcedure(s): ABDOMINAL AORTIC ENDOVASCULAR STENT GRAFT WITH RIGHT AND  LEFT ILIAC BRANCH EXTENSIONS  Post operative care was uneventful.  The left groin was opened for exposure of the left common femoral artery.  The left groin incision is healing well without hematoma.  He did develop mild ecchymosis.  Right groin without hematoma.  Disposition stable.  He has tolerated  PO's, voided, and ambulated.  F/U in our office in 4 weeks with CTA Abd/pelvis.    Significant Diagnostic Studies: CBC Lab Results  Component Value Date   WBC 15.0 (H) 09/11/2017   HGB 13.4 09/11/2017   HCT 40.3 09/11/2017   MCV 93.9 09/11/2017   PLT 177 09/11/2017    BMET    Component Value Date/Time   NA 136 09/11/2017 0210   K 4.5 09/11/2017 0210   CL 104 09/11/2017 0210   CO2 23 09/11/2017 0210   GLUCOSE 118 (H) 09/11/2017 0210   BUN 11 09/11/2017 0210   CREATININE 0.94 09/11/2017 0210   CALCIUM 8.7 (L) 09/11/2017 0210   GFRNONAA >60 09/11/2017 0210   GFRAA >60 09/11/2017 0210   COAG Lab Results  Component Value Date   INR 1.06 09/06/2017     Disposition:  Discharge to :Home Discharge Instructions    Call MD for:  redness, tenderness, or signs of infection (pain, swelling, bleeding, redness, odor or green/yellow discharge around incision site)   Complete by:  As directed    Call MD for:  severe or increased pain, loss or decreased feeling  in affected limb(s)   Complete by:  As directed    Call MD for:  temperature >100.5   Complete by:  As directed    Resume previous diet   Complete by:  As directed      Allergies as of 09/11/2017   No Known Allergies  Medication List    TAKE these medications   cetirizine-pseudoephedrine 5-120 MG tablet Commonly known as:  ZYRTEC-D Take 1 tablet by mouth 2 (two) times daily as needed for allergies.   ibuprofen 200 MG tablet Commonly known as:  ADVIL,MOTRIN Take 400 mg by mouth as needed for moderate pain.   naproxen sodium 220 MG tablet Commonly known as:  ALEVE Take 220 mg by mouth 2 (two) times daily as needed (for knee pain/arthritis pain.).   oxyCODONE 5 MG immediate release tablet Commonly known as:  Oxy IR/ROXICODONE Take 1 tablet (5 mg total) by mouth every 4 (four) hours as needed (for pain score of 1-4).   oxyCODONE-acetaminophen 5-325 MG tablet Commonly known as:  PERCOCET/ROXICET Take 1-2  tablets by mouth every 4 (four) hours as needed for moderate pain.      Verbal and written Discharge instructions given to the patient. Wound care per Discharge AVS Follow-up Information    Conrad Henning, MD Follow up in 4 week(s).   Specialties:  Vascular Surgery, Cardiology Why:  office will call Contact information: [address] [place] [address] [phone]           Signed: Roxy Horseman 09/12/2017, 9:50 AM   Addendum  Nathaniel Kirk is a 72 y.o. (07/07/1946) male underwent an EVAR with B IBD placement for moderate AAA, large B CIA and small L IIA aneurysm.  Pt has bladder cancer, so preservation of bilateral internal iliac artery was felt essentially in the evident that bladder resection was going to be needed.  He had an uneventful procedure and uneventful post-operative recovery.  He will follow up in the office in 4 weeks with a CTA abd/pelvis per protocol.  Adele Barthel, MD, FACS [facility] Office: [phone] Pager: [phone]  09/12/2017, 11:01 AM   - For VQI Registry use --- Instructions: Press F2 to tab through selections.  Delete question if not applicable.   Post-op:  Time to Extubation: [x ] In OR, [ ]  < 12 hrs, [ ]  12-24 hrs, [ ]  >=24 hrs Vasopressors Req. Post-op: No MI: [x ] No, [ ]  Troponin only, [ ]  EKG or Clinical New Arrhythmia: No CHF: No ICU Stay: 0 days Transfusion: No  If yes, 0 units given  Complications: Resp failure: [ x] none, [ ]  Pneumonia, [ ]  Ventilator Chg in renal function: [x ] none, [ ]  Inc. Cr > 0.5, [ ]  Temp. Dialysis, [ ]  Permanent dialysis Leg ischemia: [x ] No, [ ]  Yes, no Surgery needed, [ ]  Yes, Surgery needed, [ ]  Amputation Bowel ischemia: [x ] No, [ ]  Medical Rx, [ ]  Surgical Rx Wound complication: [x ] No, [ ]  Superficial separation/infection, [ ]  Return to OR Return to OR: No  Return to OR for bleeding: No Stroke: [x ] None, [ ]  Minor, [ ]  Major  Discharge  medications: Statin use:  No  for medical reason   ASA use:  No  for medical reason   Plavix use:  No  for medical reason   Beta blocker use:  No  for medical reason

## 2017-09-13 ENCOUNTER — Encounter (HOSPITAL_COMMUNITY): Payer: Self-pay | Admitting: Vascular Surgery

## 2017-09-14 ENCOUNTER — Telehealth: Payer: Self-pay | Admitting: Vascular Surgery

## 2017-09-14 NOTE — Telephone Encounter (Signed)
Sched appts 10/10/17; CTA at 12:00 at [place] and MD at 1:15. Spoke to pt to inform them of appts and cta instructions.

## 2017-09-14 NOTE — Telephone Encounter (Signed)
-----   Message from Mena Goes, RN sent at 09/11/2017  8:36 AM EST ----- Regarding: 4 weeks CTA A/P   ----- Message ----- From: Ulyses Amor, PA-C Sent: 09/11/2017   7:30 AM To: Vvs Charge Pool  F/U with Dr. Bridgett Larsson in 4 weeks s/p EVAR needs CTA Abd/pelvis

## 2017-10-01 ENCOUNTER — Encounter (HOSPITAL_COMMUNITY): Payer: Self-pay | Admitting: Vascular Surgery

## 2017-10-08 NOTE — Progress Notes (Signed)
    Post-operative EVAR   History of Present Illness   Nathaniel Kirk is a 72 y.o. male who presents post-op s/p EVAR & B IBE (09/10/17).  Most recent CTA (10/10/2017) demonstrates: no endoleak and stable sac size.  The patient has had noback or abdominal pain.   For VQI Use Only   PRE-ADM LIVING: Home  AMB STATUS: Ambulatory   Physical Examination   Vitals:   10/10/17 1317  BP: (!) 161/81  Pulse: 67  Resp: 20  Temp: (!) 97 F (36.1 C)  TempSrc: Oral  SpO2: 100%  Weight: 251 lb (113.9 kg)  Height: 5' 9.5" (1.765 m)    Vascular Vessel Right Left  Aorta Not palpable N/A  Femoral Palpable Palpable    Gastrointestinal soft, non-distended, non-tender to palpation, No guarding or rebound, no HSM, no masses, no CVAT B, No palpable prominent aortic pulse,       Radiology   CTA Abd & Pelvis (10/10/2017)  Official read pending  Based on my review of this patient's CTA, main body appears to be in infrarenal position with patent B IBE.  There is some thrombus in each limb related to the overlap of the bridging pieces.  The right bridging piece appears to be  somewhat unopposed within the R IBE.   Medical Decision Making   Nathaniel Kirk is a 72 y.o. male who presents s/p EVAR with B IBE   Pt is asymptomatic with stable sac size.  No shrinkage is expect on the initial CTA.  The thrombus in the limbs is a reflection of the bridging pieces that needed to be placed between the main body and IBE.  The thrombus is likely due to flow voids caused by the bridging piece.    Essential the limbs will "remodel", walling away the areas without active inline flow.  I do not expect any complications from such.  I discussed with the patient the importance of surveillance of the endograft.  The next endograft duplex will be scheduled for 6 months.  The next CTA will be scheduled for 12 months.  The patient will follow up at the above intervals.  Thank you for allowing [place] to  participate in this patient's care.   Adele Barthel, MD, FACS [facility] Office: [phone] Pager: [phone]

## 2017-10-10 ENCOUNTER — Ambulatory Visit
Admission: RE | Admit: 2017-10-10 | Discharge: 2017-10-10 | Disposition: A | Discharge status: Home / Self Care | Payer: Medicare Other | Source: Ambulatory Visit | Attending: Vascular Surgery | Admitting: Vascular Surgery

## 2017-10-10 ENCOUNTER — Encounter: Payer: Self-pay | Admitting: Vascular Surgery

## 2017-10-10 ENCOUNTER — Encounter: Payer: Medicare Other | Admitting: Vascular Surgery

## 2017-10-10 ENCOUNTER — Other Ambulatory Visit: Payer: Self-pay

## 2017-10-10 ENCOUNTER — Ambulatory Visit (INDEPENDENT_AMBULATORY_CARE_PROVIDER_SITE_OTHER): Payer: Medicare Other | Admitting: Vascular Surgery

## 2017-10-10 VITALS — BP 161/81 | HR 67 | Temp 97.0°F | Resp 20 | Ht 69.5 in | Wt 251.0 lb

## 2017-10-10 DIAGNOSIS — I723 Aneurysm of iliac artery: Secondary | ICD-10-CM

## 2017-10-10 DIAGNOSIS — Z48812 Encounter for surgical aftercare following surgery on the circulatory system: Secondary | ICD-10-CM

## 2017-10-10 DIAGNOSIS — I714 Abdominal aortic aneurysm, without rupture, unspecified: Secondary | ICD-10-CM

## 2017-10-10 MED ORDER — IOPAMIDOL (ISOVUE-370) INJECTION 76%
75.0000 mL | Freq: Once | INTRAVENOUS | Status: AC | PRN
  Administered 2017-10-10: 75 mL via INTRAVENOUS

## 2017-10-11 ENCOUNTER — Other Ambulatory Visit: Payer: Self-pay

## 2017-10-11 DIAGNOSIS — I714 Abdominal aortic aneurysm, without rupture, unspecified: Secondary | ICD-10-CM

## 2017-11-21 ENCOUNTER — Telehealth: Payer: Self-pay | Admitting: Oncology

## 2017-11-21 ENCOUNTER — Encounter: Payer: Self-pay | Admitting: Oncology

## 2017-11-21 ENCOUNTER — Telehealth: Payer: Self-pay | Admitting: *Deleted

## 2017-11-21 NOTE — Telephone Encounter (Signed)
Tried returning patient's phone call but his voice mail is not set up to receive messages. Will try again tomorrow.

## 2017-11-21 NOTE — Telephone Encounter (Signed)
Appt has been scheduled for the pt to see Dr. Alen Blew on 5/7 at 2pm. Unable to reach the pt regarding the change to his appt. He was originally scheduled to see Dr. Alen Blew on 5/15 at 11am.  Letter mailed.

## 2017-11-27 ENCOUNTER — Inpatient Hospital Stay: Payer: Medicare Other | Attending: Oncology | Admitting: Oncology

## 2017-11-27 ENCOUNTER — Telehealth: Payer: Self-pay | Admitting: Oncology

## 2017-11-27 DIAGNOSIS — Z720 Tobacco use: Secondary | ICD-10-CM | POA: Diagnosis not present

## 2017-11-27 DIAGNOSIS — C679 Malignant neoplasm of bladder, unspecified: Secondary | ICD-10-CM | POA: Diagnosis not present

## 2017-11-27 DIAGNOSIS — R599 Enlarged lymph nodes, unspecified: Secondary | ICD-10-CM | POA: Diagnosis not present

## 2017-11-27 DIAGNOSIS — C775 Secondary and unspecified malignant neoplasm of intrapelvic lymph nodes: Secondary | ICD-10-CM | POA: Insufficient documentation

## 2017-11-27 DIAGNOSIS — Z5111 Encounter for antineoplastic chemotherapy: Secondary | ICD-10-CM | POA: Insufficient documentation

## 2017-11-27 MED ORDER — LIDOCAINE-PRILOCAINE 2.5-2.5 % EX CREA: 1.0000 "application " | TOPICAL_CREAM | CUTANEOUS | 0 refills | Status: DC | PRN

## 2017-11-27 MED ORDER — PROCHLORPERAZINE MALEATE 10 MG PO TABS: 10.0000 mg | ORAL_TABLET | Freq: Four times a day (QID) | ORAL | 0 refills | Status: DC | PRN

## 2017-11-27 NOTE — Progress Notes (Signed)
Reason for Referral: Bladder cancer  HPI: 72 year old gentleman of [place] where he lived the majority of his life.  He was in his usual state of health they started developing hematuria in January 2019.  At that time he was evaluated by Dr. 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  At that time he was diagnosed with abdominal aneurysm during his work-up for his bladder cancer.  He underwent an endovascular repair in February 2018.  He subsequently establish care at [facility] for evaluation for radical cystectomy.  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  He was also evaluated by Dr. Rebbeca Paul for the evaluation for possible neoadjuvant chemotherapy and repeat imaging studies showed a mild retroperitoneal and pelvic adenopathy that are suspicious for metastasis.  He is scheduled to have a PET/CT scan and a biopsy this week.  He was sent to me for evaluation to administer chemotherapy locally in the neoadjuvant setting.  Clinically, he reports no complaints related to his bladder cancer.  He denies any abdominal pain or discomfort.  He denies any hematuria or dysuria.  He has regained all activities of daily living.  He does not report any headaches, blurry vision, syncope or seizures. Does not report any fevers, chills or sweats.  Does not report any cough, wheezing or hemoptysis.  Does not report any chest pain, palpitation, orthopnea or leg edema.  Does not report any nausea, vomiting or abdominal pain.  Does not report any constipation or diarrhea.  Does not report any skeletal complaints.    Does not report frequency, urgency or hematuria.  Does not report any skin rashes or lesions. Does not report any heat or cold intolerance.  Does not report any lymphadenopathy or petechiae.  Does not report any anxiety or depression.   Remaining review of systems is negative.    Past Medical History:  Diagnosis Date  . Arthritis    knees  . Bladder tumor    removed 08/31/17  post op bleeding   . 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 surgery scheduled with Dr. Bridgett Larsson 09-10-17  . Pneumonia    walking PNA  2016 or 2017  . UTI (urinary tract infection)   :  Past Surgical History:  Procedure Laterality Date  . ABDOMINAL AORTIC ENDOVASCULAR STENT GRAFT N/A 09/10/2017   Procedure: ABDOMINAL AORTIC ENDOVASCULAR STENT GRAFT WITH RIGHT AND  LEFT ILIAC BRANCH EXTENSIONS;  Surgeon: Conrad Darlington, MD;  Location: [place];  Service: Vascular;  Laterality: N/A;  . COLON SURGERY     diverticulitis foot of colon out  . CYSTOSCOPY W/ RETROGRADES Bilateral 08/31/2017   Procedure: CYSTOSCOPY WITH RETROGRADE PYELOGRAM;  Surgeon: Cleon Gustin, MD;  Location: [facility];  Service: Urology;  Laterality: Bilateral;  . HERNIA REPAIR     umbilical  . TONSILLECTOMY     as kid  . TRANSURETHRAL RESECTION OF BLADDER TUMOR N/A 08/31/2017   Procedure: TRANSURETHRAL RESECTION OF BLADDER TUMOR (TURBT);  Surgeon: Cleon Gustin, MD;  Location: [facility];  Service: Urology;  Laterality: N/A;  :   Current Outpatient Medications:  .  cetirizine-pseudoephedrine (ZYRTEC-D) 5-120 MG tablet, Take 1 tablet by mouth 2 (two) times daily as needed for allergies., Disp: , Rfl:  .  naproxen sodium (ALEVE) 220 MG tablet, Take 220 mg by mouth 2 (two) times  daily as needed (for knee pain/arthritis pain.). , Disp: , Rfl:  .  lidocaine-prilocaine (EMLA) cream, Apply 1 application topically as needed., Disp: 30 g, Rfl: 0 .  prochlorperazine (COMPAZINE) 10 MG tablet, Take 1 tablet (10 mg total) by mouth every 6 (six) hours as needed for nausea or vomiting., Disp: 30 tablet, Rfl: 0:  No Known Allergies:  No family history on file.:  Social History   Socioeconomic History  . Marital status: Married    Spouse name: Not on  file  . Number of children: Not on file  . Years of education: Not on file  . Highest education level: Not on file  Occupational History  . Not on file  Social Needs  . Financial resource strain: Not on file  . Food insecurity:    Worry: Not on file    Inability: Not on file  . Transportation needs:    Medical: Not on file    Non-medical: Not on file  Tobacco Use  . Smoking status: Current Every Day Smoker    Years: 40.00  . Smokeless tobacco: Never Used  . Tobacco comment: 1-4 cigarettes per day  Substance and Sexual Activity  . Alcohol use: Yes    Comment: special occasions  . Drug use: No  . Sexual activity: Yes  Lifestyle  . Physical activity:    Days per week: Not on file    Minutes per session: Not on file  . Stress: Not on file  Relationships  . Social connections:    Talks on phone: Not on file    Gets together: Not on file    Attends religious service: Not on file    Active member of club or organization: Not on file    Attends meetings of clubs or organizations: Not on file    Relationship status: Not on file  . Intimate partner violence:    Fear of current or ex partner: Not on file    Emotionally abused: Not on file    Physically abused: Not on file    Forced sexual activity: Not on file  Other Topics Concern  . Not on file  Social History Narrative  . Not on file  :  Pertinent items are noted in HPI.  Exam: Blood pressure (!) 142/77, pulse 72, temperature 98.4 F (36.9 C), temperature source Oral, resp. rate 17, height 5' 9.5" (1.765 m), weight 251 lb 9.6 oz (114.1 kg), SpO2 97 %.  ECOG 0 General appearance: alert and cooperative appeared without distress. Head: atraumatic without any abnormalities. Eyes: conjunctivae/corneas clear. PERRL.  Sclera anicteric. Throat: lips, mucosa, and tongue normal; without oral thrush or ulcers. Resp: clear to auscultation bilaterally without rhonchi, wheezes or dullness to percussion. Cardio: regular rate and  rhythm, S1, S2 normal, no murmur, click, rub or gallop GI: soft, non-tender; bowel sounds normal; no masses,  no organomegaly Skin: Skin color, texture, turgor normal. No rashes or lesions Lymph nodes: Cervical, supraclavicular, and axillary nodes normal. Neurologic: Grossly normal without any motor, sensory or deep tendon reflexes. Musculoskeletal: No joint deformity or effusion.  CBC    Component Value Date/Time   WBC 15.0 (H) 09/11/2017 0210   RBC 4.29 09/11/2017 0210   HGB 13.4 09/11/2017 0210   HCT 40.3 09/11/2017 0210   PLT 177 09/11/2017 0210   MCV 93.9 09/11/2017 0210   MCH 31.2 09/11/2017 0210   MCHC 33.3 09/11/2017 0210   RDW 13.5 09/11/2017 0210     Chemistry  Component Value Date/Time   NA 136 09/11/2017 0210   K 4.5 09/11/2017 0210   CL 104 09/11/2017 0210   CO2 23 09/11/2017 0210   BUN 11 09/11/2017 0210   CREATININE 0.94 09/11/2017 0210      Component Value Date/Time   CALCIUM 8.7 (L) 09/11/2017 0210   ALKPHOS 59 09/06/2017 0947   AST 24 09/06/2017 0947   ALT 20 09/06/2017 0947   BILITOT 1.1 09/06/2017 0947       Assessment and plan:  72 year old gentleman with the following issues:  1.  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  CT scan obtained in April 2019 showed mild adenopathy that are suspicious for metastatic disease versus reactive findings.  The natural course of this disease was reviewed today as well as definitive treatment options.  Despite his potential adenopathy that could be reactive in nature, upfront systemic chemotherapy would be his best approach.  Neoadjuvant chemotherapy given for 4 cycles or potentially longer if he has systemic disease would be a reasonable start given his high risk urothelial carcinoma.  And after completing his chemotherapy, if he experienced a clinical complete response, radical cystectomy would be his best choice for curative intent at that time.  The rationale for  using cisplatin and gemcitabine neoadjuvant chemotherapy was reviewed today.  Complications from these drugs were reviewed today in detail.  These were include nausea, vomiting, myelosuppression, neutropenia, neutropenic sepsis, renal insufficiency, electrolyte imbalance and rarely thrombosis, hospitalizations and death.  After discussion today he is agreeable to proceed in the immediate future after chemotherapy education class.  2.  IV access risks and benefits of a Port-A-Cath insertion was reviewed today and he is agreeable to have that done in the immediate future.  EMLA cream will be available to him.  3.  Antiemetics: Prescription for Compazine will be made available to him.  4.  Renal function surveillance: Given his cisplatin exposure, we have discussed strategies to maintain his renal function.  His creatinine clearance is more than 60 cc/min and his baseline kidney function should be adequate.  We will avoid nephrotoxic drugs including nonsteroidal anti-inflammatories.  5.  Follow-up: We will be in the immediate future to start chemotherapy.  60  minutes was spent with the patient face-to-face today.  More than 50% of time was dedicated to patient counseling, education and coordination of his multifaceted care.

## 2017-11-27 NOTE — Telephone Encounter (Signed)
Gave patient avs report and appointments for May thru July. [facility] will call re port placement.

## 2017-11-27 NOTE — Progress Notes (Signed)
START ON PATHWAY REGIMEN - Bladder     A cycle is every 21 days:     Gemcitabine      Cisplatin   **Always confirm dose/schedule in your pharmacy ordering system**  Patient Characteristics: Pre Cystectomy, Clinical T2-T4a, N0-1, M0, Cystectomy Eligible, Cisplatin-Based Chemotherapy Indicated (CrCl ? 50 mL/min and Minimal or No Symptoms) AJCC M Category: M0 AJCC N Category: N0 AJCC T Category: T2 Current evidence of distant metastases<= No AJCC 8 Stage Grouping: II Intent of Therapy: Curative Intent, Discussed with Patient 

## 2017-12-03 ENCOUNTER — Telehealth: Payer: Self-pay | Admitting: *Deleted

## 2017-12-03 ENCOUNTER — Inpatient Hospital Stay: Payer: Medicare Other

## 2017-12-03 NOTE — Telephone Encounter (Signed)
I called and spoke to patient about setting up the appointment for a port-a-cath. Radiology at [facility] has been trying to reach him, but his cell phone is not set up to receive voice messages. I gave him [phone], per Tiffany in Special Procedures, and told him that he needs to call and set up the appointment. He verbalized understanding.

## 2017-12-04 ENCOUNTER — Encounter: Payer: Self-pay | Admitting: Oncology

## 2017-12-04 NOTE — Progress Notes (Signed)
Called pt to introduce myself as his Arboriculturist and to discuss copay assistance.  Pt informed me that he has met his deductible so his ins should pay for his treatment at 100%.  I will give him my card at his next visit in case he would like to apply for copay assistance in the future.  I informed him of the [place] and gave him the income requirement but he exceeds it so he doesn't qualify for the grant.

## 2017-12-05 ENCOUNTER — Inpatient Hospital Stay: Payer: Medicare Other

## 2017-12-05 ENCOUNTER — Ambulatory Visit: Payer: Medicare Other | Admitting: Oncology

## 2017-12-05 VITALS — BP 141/82 | HR 66 | Temp 97.9°F | Resp 17

## 2017-12-05 DIAGNOSIS — Z5111 Encounter for antineoplastic chemotherapy: Secondary | ICD-10-CM | POA: Diagnosis not present

## 2017-12-05 DIAGNOSIS — C775 Secondary and unspecified malignant neoplasm of intrapelvic lymph nodes: Secondary | ICD-10-CM | POA: Diagnosis not present

## 2017-12-05 DIAGNOSIS — C679 Malignant neoplasm of bladder, unspecified: Secondary | ICD-10-CM

## 2017-12-05 LAB — CMP (CANCER CENTER ONLY)
ALT: 14 U/L (ref 0–55)
AST: 16 U/L (ref 5–34)
Albumin: 4 g/dL (ref 3.5–5.0)
Alkaline Phosphatase: 71 U/L (ref 40–150)
Anion gap: 6 (ref 3–11)
BILIRUBIN TOTAL: 0.4 mg/dL (ref 0.2–1.2)
BUN: 24 mg/dL (ref 7–26)
CHLORIDE: 110 mmol/L — AB (ref 98–109)
CO2: 24 mmol/L (ref 22–29)
Calcium: 9.3 mg/dL (ref 8.4–10.4)
Creatinine: 0.96 mg/dL (ref 0.70–1.30)
GFR, Est AFR Am: >60 mL/min (ref >60)
Glucose, Bld: 80 mg/dL (ref 70–140)
POTASSIUM: 4 mmol/L (ref 3.5–5.1)
Sodium: 140 mmol/L (ref 136–145)
TOTAL PROTEIN: 6.9 g/dL (ref 6.4–8.3)

## 2017-12-05 LAB — CBC WITH DIFFERENTIAL (CANCER CENTER ONLY)
BASOS ABS: 0.1 10*3/uL (ref 0.0–0.1)
Basophils Relative: 1 %
EOS ABS: 0.2 10*3/uL (ref 0.0–0.5)
EOS PCT: 2 %
HEMATOCRIT: 45.2 % (ref 38.4–49.9)
HEMOGLOBIN: 15.1 g/dL (ref 13.0–17.1)
LYMPHS ABS: 1.5 10*3/uL (ref 0.9–3.3)
LYMPHS PCT: 19 %
MCH: 31.3 pg (ref 27.2–33.4)
MCHC: 33.4 g/dL (ref 32.0–36.0)
MCV: 93.6 fL (ref 79.3–98.0)
Monocytes Absolute: 0.8 10*3/uL (ref 0.1–0.9)
Monocytes Relative: 10 %
NEUTROS PCT: 68 %
Neutro Abs: 5.2 10*3/uL (ref 1.5–6.5)
Platelet Count: 198 10*3/uL (ref 140–400)
RBC: 4.83 MIL/uL (ref 4.20–5.82)
RDW: 13.4 % (ref 11.0–14.6)
WBC: 7.7 10*3/uL (ref 4.0–10.3)

## 2017-12-05 MED ORDER — SODIUM CHLORIDE 0.9 % IV SOLN
70.0000 mg/m2 | Freq: Once | INTRAVENOUS | Status: AC
  Administered 2017-12-05: 166 mg via INTRAVENOUS
  Filled 2017-12-05: qty 166

## 2017-12-05 MED ORDER — PALONOSETRON HCL INJECTION 0.25 MG/5ML
INTRAVENOUS | Status: AC
  Filled 2017-12-05: qty 5

## 2017-12-05 MED ORDER — SODIUM CHLORIDE 0.9 % IV SOLN
1000.0000 mg/m2 | Freq: Once | INTRAVENOUS | Status: AC
  Administered 2017-12-05: 2356 mg via INTRAVENOUS
  Filled 2017-12-05: qty 61.96

## 2017-12-05 MED ORDER — POTASSIUM CHLORIDE 2 MEQ/ML IV SOLN
Freq: Once | INTRAVENOUS | Status: AC
  Administered 2017-12-05: 10:00:00 via INTRAVENOUS
  Filled 2017-12-05: qty 10

## 2017-12-05 MED ORDER — FOSAPREPITANT DIMEGLUMINE INJECTION 150 MG
Freq: Once | INTRAVENOUS | Status: AC
  Administered 2017-12-05: 13:00:00 via INTRAVENOUS
  Filled 2017-12-05: qty 5

## 2017-12-05 MED ORDER — SODIUM CHLORIDE 0.9 % IV SOLN
Freq: Once | INTRAVENOUS | Status: AC
  Administered 2017-12-05: 09:00:00 via INTRAVENOUS

## 2017-12-05 MED ORDER — PALONOSETRON HCL INJECTION 0.25 MG/5ML
0.2500 mg | Freq: Once | INTRAVENOUS | Status: AC
  Administered 2017-12-05: 0.25 mg via INTRAVENOUS

## 2017-12-05 NOTE — Patient Instructions (Signed)
[facility] [facility] [facility] Discharge Instructions for Patients Receiving Chemotherapy  Today you received the following chemotherapy agents: Cisplatin, Gemzar    To help prevent nausea and vomiting after your treatment, we encourage you to take your nausea medication as prescribed.   If you develop nausea and vomiting that is not controlled by your nausea medication, call the clinic.   BELOW ARE SYMPTOMS THAT SHOULD BE REPORTED IMMEDIATELY:  *FEVER GREATER THAN 100.5 F  *CHILLS WITH OR WITHOUT FEVER  NAUSEA AND VOMITING THAT IS NOT CONTROLLED WITH YOUR NAUSEA MEDICATION  *UNUSUAL SHORTNESS OF BREATH  *UNUSUAL BRUISING OR BLEEDING  TENDERNESS IN MOUTH AND THROAT WITH OR WITHOUT PRESENCE OF ULCERS  *URINARY PROBLEMS  *BOWEL PROBLEMS  UNUSUAL RASH Items with * indicate a potential emergency and should be followed up as soon as possible.  Feel free to call the clinic should you have any questions or concerns. The clinic phone number is [phone].  Please show the [place] at check-in to the Emergency Department and triage nurse.  Cisplatin injection What is this medicine? CISPLATIN (SIS pla tin) is a chemotherapy drug. It targets fast dividing cells, like cancer cells, and causes these cells to die. This medicine is used to treat many types of cancer like bladder, ovarian, and testicular cancers. This medicine may be used for other purposes; ask your health care provider or pharmacist if you have questions. COMMON BRAND NAME(S): Platinol, Platinol -AQ What should I tell my health care provider before I take this medicine? They need to know if you have any of these conditions: -blood disorders -hearing problems -kidney disease -recent or ongoing radiation therapy -an unusual or allergic reaction to cisplatin, carboplatin, other chemotherapy, other medicines, foods, dyes, or preservatives -pregnant or trying to get pregnant -breast-feeding How should I use this  medicine? This drug is given as an infusion into a vein. It is administered in a hospital or clinic by a specially trained health care professional. Talk to your pediatrician regarding the use of this medicine in children. Special care may be needed. Overdosage: If you think you have taken too much of this medicine contact a poison control center or emergency room at once. NOTE: This medicine is only for you. Do not share this medicine with others. What if I miss a dose? It is important not to miss a dose. Call your doctor or health care professional if you are unable to keep an appointment. What may interact with this medicine? -dofetilide -foscarnet -medicines for seizures -medicines to increase blood counts like filgrastim, pegfilgrastim, sargramostim -probenecid -pyridoxine used with altretamine -rituximab -some antibiotics like amikacin, gentamicin, neomycin, polymyxin B, streptomycin, tobramycin -sulfinpyrazone -vaccines -zalcitabine Talk to your doctor or health care professional before taking any of these medicines: -acetaminophen -aspirin -ibuprofen -ketoprofen -naproxen This list may not describe all possible interactions. Give your health care provider a list of all the medicines, herbs, non-prescription drugs, or dietary supplements you use. Also tell them if you smoke, drink alcohol, or use illegal drugs. Some items may interact with your medicine. What should I watch for while using this medicine? Your condition will be monitored carefully while you are receiving this medicine. You will need important blood work done while you are taking this medicine. This drug may make you feel generally unwell. This is not uncommon, as chemotherapy can affect healthy cells as well as cancer cells. Report any side effects. Continue your course of treatment even though you feel ill unless your doctor tells  you to stop. In some cases, you may be given additional medicines to help with side  effects. Follow all directions for their use. Call your doctor or health care professional for advice if you get a fever, chills or sore throat, or other symptoms of a cold or flu. Do not treat yourself. This drug decreases your body's ability to fight infections. Try to avoid being around people who are sick. This medicine may increase your risk to bruise or bleed. Call your doctor or health care professional if you notice any unusual bleeding. Be careful brushing and flossing your teeth or using a toothpick because you may get an infection or bleed more easily. If you have any dental work done, tell your dentist you are receiving this medicine. Avoid taking products that contain aspirin, acetaminophen, ibuprofen, naproxen, or ketoprofen unless instructed by your doctor. These medicines may hide a fever. Do not become pregnant while taking this medicine. Women should inform their doctor if they wish to become pregnant or think they might be pregnant. There is a potential for serious side effects to an unborn child. Talk to your health care professional or pharmacist for more information. Do not breast-feed an infant while taking this medicine. Drink fluids as directed while you are taking this medicine. This will help protect your kidneys. Call your doctor or health care professional if you get diarrhea. Do not treat yourself. What side effects may I notice from receiving this medicine? Side effects that you should report to your doctor or health care professional as soon as possible: -allergic reactions like skin rash, itching or hives, swelling of the face, lips, or tongue -signs of infection - fever or chills, cough, sore throat, pain or difficulty passing urine -signs of decreased platelets or bleeding - bruising, pinpoint red spots on the skin, black, tarry stools, nosebleeds -signs of decreased red blood cells - unusually weak or tired, fainting spells, lightheadedness -breathing  problems -changes in hearing -gout pain -low blood counts - This drug may decrease the number of white blood cells, red blood cells and platelets. You may be at increased risk for infections and bleeding. -nausea and vomiting -pain, swelling, redness or irritation at the injection site -pain, tingling, numbness in the hands or feet -problems with balance, movement -trouble passing urine or change in the amount of urine Side effects that usually do not require medical attention (report to your doctor or health care professional if they continue or are bothersome): -changes in vision -loss of appetite -metallic taste in the mouth or changes in taste This list may not describe all possible side effects. Call your doctor for medical advice about side effects. You may report side effects to FDA at 1-800-FDA-1088. Where should I keep my medicine? This drug is given in a hospital or clinic and will not be stored at home. NOTE: This sheet is a summary. It may not cover all possible information. If you have questions about this medicine, talk to your doctor, pharmacist, or health care provider.  2018 Elsevier/Gold Standard (2007-10-15 14:40:54) Gemcitabine injection What is this medicine? GEMCITABINE (jem SIT a been) is a chemotherapy drug. This medicine is used to treat many types of cancer like breast cancer, lung cancer, pancreatic cancer, and ovarian cancer. This medicine may be used for other purposes; ask your health care provider or pharmacist if you have questions. COMMON BRAND NAME(S): Gemzar What should I tell my health care provider before I take this medicine? They need to know if you  have any of these conditions: -blood disorders -infection -kidney disease -liver disease -recent or ongoing radiation therapy -an unusual or allergic reaction to gemcitabine, other chemotherapy, other medicines, foods, dyes, or preservatives -pregnant or trying to get pregnant -breast-feeding How  should I use this medicine? This drug is given as an infusion into a vein. It is administered in a hospital or clinic by a specially trained health care professional. Talk to your pediatrician regarding the use of this medicine in children. Special care may be needed. Overdosage: If you think you have taken too much of this medicine contact a poison control center or emergency room at once. NOTE: This medicine is only for you. Do not share this medicine with others. What if I miss a dose? It is important not to miss your dose. Call your doctor or health care professional if you are unable to keep an appointment. What may interact with this medicine? -medicines to increase blood counts like filgrastim, pegfilgrastim, sargramostim -some other chemotherapy drugs like cisplatin -vaccines Talk to your doctor or health care professional before taking any of these medicines: -acetaminophen -aspirin -ibuprofen -ketoprofen -naproxen This list may not describe all possible interactions. Give your health care provider a list of all the medicines, herbs, non-prescription drugs, or dietary supplements you use. Also tell them if you smoke, drink alcohol, or use illegal drugs. Some items may interact with your medicine. What should I watch for while using this medicine? Visit your doctor for checks on your progress. This drug may make you feel generally unwell. This is not uncommon, as chemotherapy can affect healthy cells as well as cancer cells. Report any side effects. Continue your course of treatment even though you feel ill unless your doctor tells you to stop. In some cases, you may be given additional medicines to help with side effects. Follow all directions for their use. Call your doctor or health care professional for advice if you get a fever, chills or sore throat, or other symptoms of a cold or flu. Do not treat yourself. This drug decreases your body's ability to fight infections. Try to avoid  being around people who are sick. This medicine may increase your risk to bruise or bleed. Call your doctor or health care professional if you notice any unusual bleeding. Be careful brushing and flossing your teeth or using a toothpick because you may get an infection or bleed more easily. If you have any dental work done, tell your dentist you are receiving this medicine. Avoid taking products that contain aspirin, acetaminophen, ibuprofen, naproxen, or ketoprofen unless instructed by your doctor. These medicines may hide a fever. Women should inform their doctor if they wish to become pregnant or think they might be pregnant. There is a potential for serious side effects to an unborn child. Talk to your health care professional or pharmacist for more information. Do not breast-feed an infant while taking this medicine. What side effects may I notice from receiving this medicine? Side effects that you should report to your doctor or health care professional as soon as possible: -allergic reactions like skin rash, itching or hives, swelling of the face, lips, or tongue -low blood counts - this medicine may decrease the number of white blood cells, red blood cells and platelets. You may be at increased risk for infections and bleeding. -signs of infection - fever or chills, cough, sore throat, pain or difficulty passing urine -signs of decreased platelets or bleeding - bruising, pinpoint red spots on the skin,  black, tarry stools, blood in the urine -signs of decreased red blood cells - unusually weak or tired, fainting spells, lightheadedness -breathing problems -chest pain -mouth sores -nausea and vomiting -pain, swelling, redness at site where injected -pain, tingling, numbness in the hands or feet -stomach pain -swelling of ankles, feet, hands -unusual bleeding Side effects that usually do not require medical attention (report to your doctor or health care professional if they continue or  are bothersome): -constipation -diarrhea -hair loss -loss of appetite -stomach upset This list may not describe all possible side effects. Call your doctor for medical advice about side effects. You may report side effects to FDA at 1-800-FDA-1088. Where should I keep my medicine? This drug is given in a hospital or clinic and will not be stored at home. NOTE: This sheet is a summary. It may not cover all possible information. If you have questions about this medicine, talk to your doctor, pharmacist, or health care provider.  2018 Elsevier/Gold Standard (2007-11-19 18:45:54)

## 2017-12-10 ENCOUNTER — Other Ambulatory Visit: Payer: Self-pay | Admitting: Radiology

## 2017-12-11 ENCOUNTER — Ambulatory Visit (HOSPITAL_COMMUNITY)
Admission: RE | Admit: 2017-12-11 | Discharge: 2017-12-11 | Disposition: A | Discharge status: Home / Self Care | Payer: Medicare Other | Source: Ambulatory Visit | Attending: Oncology | Admitting: Oncology

## 2017-12-11 ENCOUNTER — Encounter (HOSPITAL_COMMUNITY): Payer: Self-pay

## 2017-12-11 ENCOUNTER — Other Ambulatory Visit: Payer: Self-pay | Admitting: Oncology

## 2017-12-11 DIAGNOSIS — I714 Abdominal aortic aneurysm, without rupture: Secondary | ICD-10-CM | POA: Diagnosis not present

## 2017-12-11 DIAGNOSIS — F17210 Nicotine dependence, cigarettes, uncomplicated: Secondary | ICD-10-CM | POA: Insufficient documentation

## 2017-12-11 DIAGNOSIS — I739 Peripheral vascular disease, unspecified: Secondary | ICD-10-CM | POA: Insufficient documentation

## 2017-12-11 DIAGNOSIS — M170 Bilateral primary osteoarthritis of knee: Secondary | ICD-10-CM | POA: Insufficient documentation

## 2017-12-11 DIAGNOSIS — C679 Malignant neoplasm of bladder, unspecified: Secondary | ICD-10-CM | POA: Insufficient documentation

## 2017-12-11 HISTORY — PX: IR FLUORO GUIDE PORT INSERTION RIGHT: IMG5741

## 2017-12-11 HISTORY — PX: IR US GUIDE VASC ACCESS RIGHT: IMG2390

## 2017-12-11 LAB — CBC
HEMATOCRIT: 43.8 % (ref 39.0–52.0)
HEMOGLOBIN: 15 g/dL (ref 13.0–17.0)
MCH: 31.7 pg (ref 26.0–34.0)
MCHC: 34.2 g/dL (ref 30.0–36.0)
MCV: 92.6 fL (ref 78.0–100.0)
Platelets: 130 10*3/uL — ABNORMAL LOW (ref 150–400)
RBC: 4.73 MIL/uL (ref 4.22–5.81)
RDW: 12.9 % (ref 11.5–15.5)
WBC: 6.3 10*3/uL (ref 4.0–10.5)

## 2017-12-11 LAB — PROTIME-INR
INR: 0.9
PROTHROMBIN TIME: 12.1 s (ref 11.4–15.2)

## 2017-12-11 LAB — APTT: aPTT: 25 seconds (ref 24–36)

## 2017-12-11 MED ORDER — HEPARIN SOD (PORK) LOCK FLUSH 100 UNIT/ML IV SOLN
INTRAVENOUS | Status: AC
  Filled 2017-12-11: qty 5

## 2017-12-11 MED ORDER — FENTANYL CITRATE (PF) 100 MCG/2ML IJ SOLN
INTRAMUSCULAR | Status: AC
  Filled 2017-12-11: qty 2

## 2017-12-11 MED ORDER — LIDOCAINE HCL (PF) 1 % IJ SOLN
INTRAMUSCULAR | Status: AC | PRN
  Administered 2017-12-11: 10 mL
  Administered 2017-12-11: 5 mL

## 2017-12-11 MED ORDER — FENTANYL CITRATE (PF) 100 MCG/2ML IJ SOLN
INTRAMUSCULAR | Status: AC | PRN
  Administered 2017-12-11 (×2): 50 ug via INTRAVENOUS

## 2017-12-11 MED ORDER — SODIUM CHLORIDE 0.9 % IV SOLN
INTRAVENOUS | Status: DC
  Administered 2017-12-11: 13:00:00 via INTRAVENOUS

## 2017-12-11 MED ORDER — CEFAZOLIN SODIUM-DEXTROSE 2-4 GM/100ML-% IV SOLN
INTRAVENOUS | Status: AC
  Administered 2017-12-11: 2 g via INTRAVENOUS
  Filled 2017-12-11: qty 100

## 2017-12-11 MED ORDER — CEFAZOLIN SODIUM-DEXTROSE 2-4 GM/100ML-% IV SOLN: 2.0000 g | Freq: Once | INTRAVENOUS | Status: DC

## 2017-12-11 MED ORDER — MIDAZOLAM HCL 2 MG/2ML IJ SOLN
INTRAMUSCULAR | Status: AC
  Filled 2017-12-11: qty 4

## 2017-12-11 MED ORDER — MIDAZOLAM HCL 2 MG/2ML IJ SOLN
INTRAMUSCULAR | Status: AC | PRN
  Administered 2017-12-11 (×3): 1 mg via INTRAVENOUS

## 2017-12-11 MED ORDER — LIDOCAINE HCL 1 % IJ SOLN
INTRAMUSCULAR | Status: AC
  Filled 2017-12-11: qty 20

## 2017-12-11 NOTE — Discharge Instructions (Signed)
Please leave dressing on for 24 hours. You may bathe after 24 hours.   Do not use EMLA cream until all glue and steri-strips come off.    Implanted Port Insertion, Care After This sheet gives you information about how to care for yourself after your procedure. Your health care provider may also give you more specific instructions. If you have problems or questions, contact your health care provider. What can I expect after the procedure? After your procedure, it is common to have:  Discomfort at the port insertion site.  Bruising on the skin over the port. This should improve over 3-4 days.  Follow these instructions at home: [facility] care  After your port is placed, you will get a manufacturer's information card. The card has information about your port. Keep this card with you at all times.  Take care of the port as told by your health care provider. Ask your health care provider if you or a family member can get training for taking care of the port at home. A home health care nurse may also take care of the port.  Make sure to remember what type of port you have. Incision care  Follow instructions from your health care provider about how to take care of your port insertion site. Make sure you: ? Wash your hands with soap and water before you change your bandage (dressing). If soap and water are not available, use hand sanitizer. ? Change your dressing as told by your health care provider. ? Leave stitches (sutures), skin glue, or adhesive strips in place. These skin closures may need to stay in place for 2 weeks or longer. If adhesive strip edges start to loosen and curl up, you may trim the loose edges. Do not remove adhesive strips completely unless your health care provider tells you to do that.  Check your port insertion site every day for signs of infection. Check for: ? More redness, swelling, or pain. ? More fluid or blood. ? Warmth. ? Pus or a bad smell. General  instructions  Do not take baths, swim, or use a hot tub until your health care provider approves.  Do not lift anything that is heavier than 10 lb (4.5 kg) for a week, or as told by your health care provider.  Ask your health care provider when it is okay to: ? Return to work or school. ? Resume usual physical activities or sports.  Do not drive for 24 hours if you were given a medicine to help you relax (sedative).  Take over-the-counter and prescription medicines only as told by your health care provider.  Wear a medical alert bracelet in case of an emergency. This will tell any health care providers that you have a port.  Keep all follow-up visits as told by your health care provider. This is important. Contact a health care provider if:  You cannot flush your port with saline as directed, or you cannot draw blood from the port.  You have a fever or chills.  You have more redness, swelling, or pain around your port insertion site.  You have more fluid or blood coming from your port insertion site.  Your port insertion site feels warm to the touch.  You have pus or a bad smell coming from the port insertion site. Get help right away if:  You have chest pain or shortness of breath.  You have bleeding from your port that you cannot control. Summary  Take care of the port  as told by your health care provider.  Change your dressing as told by your health care provider.  Keep all follow-up visits as told by your health care provider. This information is not intended to replace advice given to you by your health care provider. Make sure you discuss any questions you have with your health care provider. Document Released: 04/30/2013 Document Revised: 05/31/2016 Document Reviewed: 05/31/2016 Elsevier Interactive Patient Education  2017 [place].     Moderate Conscious Sedation, Adult, Care After These instructions provide you with information about caring for yourself  after your procedure. Your health care provider may also give you more specific instructions. Your treatment has been planned according to current medical practices, but problems sometimes occur. Call your health care provider if you have any problems or questions after your procedure. What can I expect after the procedure? After your procedure, it is common:  To feel sleepy for several hours.  To feel clumsy and have poor balance for several hours.  To have poor judgment for several hours.  To vomit if you eat too soon.  Follow these instructions at home: For at least 24 hours after the procedure:   Do not: ? Participate in activities where you could fall or become injured. ? Drive. ? Use heavy machinery. ? Drink alcohol. ? Take sleeping pills or medicines that cause drowsiness. ? Make important decisions or sign legal documents. ? Take care of children on your own.  Rest. Eating and drinking  Follow the diet recommended by your health care provider.  If you vomit: ? Drink water, juice, or soup when you can drink without vomiting. ? Make sure you have little or no nausea before eating solid foods. General instructions  Have a responsible adult stay with you until you are awake and alert.  Take over-the-counter and prescription medicines only as told by your health care provider.  If you smoke, do not smoke without supervision.  Keep all follow-up visits as told by your health care provider. This is important. Contact a health care provider if:  You keep feeling nauseous or you keep vomiting.  You feel light-headed.  You develop a rash.  You have a fever. Get help right away if:  You have trouble breathing. This information is not intended to replace advice given to you by your health care provider. Make sure you discuss any questions you have with your health care provider. Document Released: 04/30/2013 Document Revised: 12/13/2015 Document Reviewed:  10/30/2015 Elsevier Interactive Patient Education  [employer].

## 2017-12-11 NOTE — Consult Note (Signed)
Chief Complaint: Patient was seen in consultation today for Port-A-Cath placement  Referring Physician(s): Wyatt Portela  Supervising Physician: Aletta Edouard  Patient Status: Nathaniel Kirk  History of Present Illness: Akaash Vandewater is a 72 y.o. male smoker with history of bladder cancer diagnosed in February of this year, s/p TURBT, who presents today for Port-A-Cath placement for chemotherapy.  Past Medical History:  Diagnosis Date  . Arthritis    knees  . Bladder tumor    removed 08/31/17  post op bleeding   . 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 surgery scheduled with Dr. Bridgett Larsson 09-10-17  . Pneumonia    walking PNA  2016 or 2017  . UTI (urinary tract infection)     Past Surgical History:  Procedure Laterality Date  . ABDOMINAL AORTIC ENDOVASCULAR STENT GRAFT N/A 09/10/2017   Procedure: ABDOMINAL AORTIC ENDOVASCULAR STENT GRAFT WITH RIGHT AND  LEFT ILIAC BRANCH EXTENSIONS;  Surgeon: Conrad Lamar, MD;  Location: [facility];  Service: Vascular;  Laterality: N/A;  . COLON SURGERY     diverticulitis foot of colon out  . CYSTOSCOPY W/ RETROGRADES Bilateral 08/31/2017   Procedure: CYSTOSCOPY WITH RETROGRADE PYELOGRAM;  Surgeon: Cleon Gustin, MD;  Location: [facility];  Service: Urology;  Laterality: Bilateral;  . HERNIA REPAIR     umbilical  . TONSILLECTOMY     as kid  . TRANSURETHRAL RESECTION OF BLADDER TUMOR N/A 08/31/2017   Procedure: TRANSURETHRAL RESECTION OF BLADDER TUMOR (TURBT);  Surgeon: Cleon Gustin, MD;  Location: [facility];  Service: Urology;  Laterality: N/A;    Allergies: Patient has no known allergies.  Medications: Prior to Admission medications   Medication Sig Start Date End Date Taking? Authorizing Provider  cetirizine-pseudoephedrine (ZYRTEC-D) 5-120 MG tablet Take 1 tablet by mouth 2 (two) times daily as needed for allergies.    [provider]  lidocaine-prilocaine (EMLA) cream Apply 1  application topically as needed. 11/27/17   Wyatt Portela, MD  naproxen sodium (ALEVE) 220 MG tablet Take 220 mg by mouth 2 (two) times daily as needed (for knee pain/arthritis pain.).     [provider]  prochlorperazine (COMPAZINE) 10 MG tablet Take 1 tablet (10 mg total) by mouth every 6 (six) hours as needed for nausea or vomiting. 11/27/17   Wyatt Portela, MD     No family history on file.  Social History   Socioeconomic History  . Marital status: Married    Spouse name: Not on file  . Number of children: Not on file  . Years of education: Not on file  . Highest education level: Not on file  Occupational History  . Not on file  Social Needs  . Financial resource strain: Not on file  . Food insecurity:    Worry: Not on file    Inability: Not on file  . Transportation needs:    Medical: Not on file    Non-medical: Not on file  Tobacco Use  . Smoking status: Current Every Day Smoker    Years: 40.00  . Smokeless tobacco: Never Used  . Tobacco comment: 1-4 cigarettes per day  Substance and Sexual Activity  . Alcohol use: Yes    Comment: special occasions  . Drug use: No  . Sexual activity: Yes  Lifestyle  . Physical activity:    Days per week: Not on file    Minutes per session: Not on file  . Stress: Not on file  Relationships  . Social connections:    Talks on phone: Not on file    Gets together: Not on file    Attends religious service: Not on file    Active member of club or organization: Not on file    Attends meetings of clubs or organizations: Not on file    Relationship status: Not on file  Other Topics Concern  . Not on file  Social History Narrative  . Not on file      Review of Systems currently denies fever, headache, chest pain, dyspnea, cough, abdominal pain, nausea, vomiting; he has had prior hematuria but no visible hematuria at this time; he has intermittent low back pain.  Vital Signs: Vitals:   12/11/17 1228  BP: 134/80    Pulse: 64  Resp: 16  Temp: 98.1 F (36.7 C)  SpO2: 98%      Physical Exam awake, alert.  Chest clear to auscultation bilaterally.  Heart with regular rate and rhythm.  Abdomen soft, positive bowel sounds, nontender.  No lower extremity edema  Imaging: No results found.  Labs:  CBC: Recent Labs    09/06/17 0947 09/10/17 1550 09/11/17 0210 12/05/17 0743  WBC 10.7* 8.9 15.0* 7.7  HGB 15.1 11.0* 13.4 15.1  HCT 45.4 32.7* 40.3 45.2  PLT 179 137* 177 198    COAGS: Recent Labs    09/06/17 0947  INR 1.06  APTT 26    BMP: Recent Labs    09/01/17 0437 09/06/17 0947 09/10/17 1550 09/11/17 0210 12/05/17 0743  NA 135 140  --  136 140  K 4.5 4.4  --  4.5 4.0  CL 105 105  --  104 110*  CO2 26 26  --  23 24  GLUCOSE 129* 103*  --  118* 80  BUN 18 12  --  11 24  CALCIUM 8.7* 9.2  --  8.7* 9.3  CREATININE 0.90 1.00 0.69 0.94 0.96  GFRNONAA >60 >60 >60 >60 >60  GFRAA >60 >60 >60 >60 >60    LIVER FUNCTION TESTS: Recent Labs    09/06/17 0947 12/05/17 0743  BILITOT 1.1 0.4  AST 24 16  ALT 20 14  ALKPHOS 59 71  PROT 6.2* 6.9  ALBUMIN 3.7 4.0    TUMOR MARKERS: No results for input(s): AFPTM, CEA, CA199, CHROMGRNA in the last 8760 hours.  Assessment and Plan: 72 y.o. male smoker with history of bladder cancer diagnosed in February of this year, s/p TURBT, who presents today for Port-A-Cath placement for chemotherapy.Risks and benefits of image guided port-a-catheter placement was discussed with the patient including, but not limited to bleeding, infection, pneumothorax, or fibrin sheath development and need for additional procedures.  All of the patient's questions were answered, patient is agreeable to proceed. Consent signed and in chart.  LABS PENDING   Thank you for this interesting consult.  I greatly enjoyed meeting Brentyn Seehafer and look forward to participating in their care.  A copy of this report was sent to the requesting provider on this  date.  Electronically Signed: D. Rowe Robert, PA-C 12/11/2017, 12:25 PM   I spent a total of  25 minutes in face to face in clinical consultation, greater than 50% of which was counseling/coordinating care for Port-A-Cath placement

## 2017-12-11 NOTE — Procedures (Signed)
Interventional Radiology Procedure Note  Procedure: Single Lumen Power Port Placement    Access:  Right IJ vein.  Findings: Catheter tip positioned at SVC/RA junction. Port is ready for immediate use.   Complications: None  EBL: < 10 mL  Recommendations:  - Ok to shower in 24 hours - Do not submerge for 7 days - Routine line care   Kimble Delaurentis T. Lannie Yusuf, M.D Pager:  [phone]   

## 2017-12-12 ENCOUNTER — Inpatient Hospital Stay: Payer: Medicare Other

## 2017-12-12 ENCOUNTER — Other Ambulatory Visit: Payer: Medicare Other

## 2017-12-12 ENCOUNTER — Encounter: Payer: Self-pay | Admitting: Oncology

## 2017-12-12 ENCOUNTER — Inpatient Hospital Stay (HOSPITAL_BASED_OUTPATIENT_CLINIC_OR_DEPARTMENT_OTHER): Payer: Medicare Other | Admitting: Oncology

## 2017-12-12 VITALS — BP 132/70 | HR 53 | Temp 98.5°F | Resp 18 | Ht 69.5 in | Wt 251.6 lb

## 2017-12-12 DIAGNOSIS — C775 Secondary and unspecified malignant neoplasm of intrapelvic lymph nodes: Secondary | ICD-10-CM | POA: Diagnosis not present

## 2017-12-12 DIAGNOSIS — C679 Malignant neoplasm of bladder, unspecified: Secondary | ICD-10-CM

## 2017-12-12 DIAGNOSIS — Z95828 Presence of other vascular implants and grafts: Secondary | ICD-10-CM | POA: Insufficient documentation

## 2017-12-12 DIAGNOSIS — Z5111 Encounter for antineoplastic chemotherapy: Secondary | ICD-10-CM

## 2017-12-12 LAB — CMP (CANCER CENTER ONLY)
ALBUMIN: 3.9 g/dL (ref 3.5–5.0)
ALT: 23 U/L (ref 0–55)
ANION GAP: 6 (ref 3–11)
AST: 18 U/L (ref 5–34)
Alkaline Phosphatase: 70 U/L (ref 40–150)
BUN: 33 mg/dL — ABNORMAL HIGH (ref 7–26)
CO2: 25 mmol/L (ref 22–29)
Calcium: 9.3 mg/dL (ref 8.4–10.4)
Chloride: 106 mmol/L (ref 98–109)
Creatinine: 1.62 mg/dL — ABNORMAL HIGH (ref 0.70–1.30)
GFR, Est AFR Am: 47 mL/min — ABNORMAL LOW (ref >60)
GFR, Estimated: 41 mL/min — ABNORMAL LOW (ref >60)
GLUCOSE: 89 mg/dL (ref 70–140)
POTASSIUM: 4.7 mmol/L (ref 3.5–5.1)
Sodium: 137 mmol/L (ref 136–145)
Total Bilirubin: 0.4 mg/dL (ref 0.2–1.2)
Total Protein: 7 g/dL (ref 6.4–8.3)

## 2017-12-12 LAB — CBC WITH DIFFERENTIAL (CANCER CENTER ONLY)
BASOS PCT: 1 %
Basophils Absolute: 0 10*3/uL (ref 0.0–0.1)
EOS ABS: 0.1 10*3/uL (ref 0.0–0.5)
EOS PCT: 1 %
HCT: 41.9 % (ref 38.4–49.9)
Hemoglobin: 14 g/dL (ref 13.0–17.1)
LYMPHS ABS: 1.9 10*3/uL (ref 0.9–3.3)
Lymphocytes Relative: 31 %
MCH: 31 pg (ref 27.2–33.4)
MCHC: 33.4 g/dL (ref 32.0–36.0)
MCV: 92.8 fL (ref 79.3–98.0)
MONOS PCT: 4 %
Monocytes Absolute: 0.2 10*3/uL (ref 0.1–0.9)
NEUTROS ABS: 3.9 10*3/uL (ref 1.5–6.5)
NEUTROS PCT: 63 %
PLATELETS: 94 10*3/uL — AB (ref 140–400)
RBC: 4.52 MIL/uL (ref 4.20–5.82)
RDW: 13.2 % (ref 11.0–14.6)
WBC Count: 6.1 10*3/uL (ref 4.0–10.3)

## 2017-12-12 MED ORDER — PROCHLORPERAZINE MALEATE 10 MG PO TABS
10.0000 mg | ORAL_TABLET | Freq: Once | ORAL | Status: AC
  Administered 2017-12-12: 10 mg via ORAL

## 2017-12-12 MED ORDER — SODIUM CHLORIDE 0.9 % IV SOLN
1000.0000 mg/m2 | Freq: Once | INTRAVENOUS | Status: AC
  Administered 2017-12-12: 2356 mg via INTRAVENOUS
  Filled 2017-12-12: qty 61.96

## 2017-12-12 MED ORDER — SODIUM CHLORIDE 0.9% FLUSH
10.0000 mL | INTRAVENOUS | Status: DC | PRN
  Administered 2017-12-12: 10 mL
  Filled 2017-12-12: qty 10

## 2017-12-12 MED ORDER — PROCHLORPERAZINE MALEATE 10 MG PO TABS
ORAL_TABLET | ORAL | Status: AC
  Filled 2017-12-12: qty 1

## 2017-12-12 MED ORDER — SODIUM CHLORIDE 0.9 % IV SOLN
Freq: Once | INTRAVENOUS | Status: AC
  Administered 2017-12-12: 14:00:00 via INTRAVENOUS

## 2017-12-12 MED ORDER — HEPARIN SOD (PORK) LOCK FLUSH 100 UNIT/ML IV SOLN
500.0000 [IU] | Freq: Once | INTRAVENOUS | Status: AC | PRN
  Administered 2017-12-12: 500 [IU]
  Filled 2017-12-12: qty 5

## 2017-12-12 NOTE — Patient Instructions (Signed)
[facility] Discharge Instructions for Patients Receiving Chemotherapy  Today you received the following chemotherapy agent: Gemzar To help prevent nausea and vomiting after your treatment, we encourage you to take your nausea medication as prescribed.  If you develop nausea and vomiting that is not controlled by your nausea medication, call the clinic.   BELOW ARE SYMPTOMS THAT SHOULD BE REPORTED IMMEDIATELY:  *FEVER GREATER THAN 100.5 F  *CHILLS WITH OR WITHOUT FEVER  NAUSEA AND VOMITING THAT IS NOT CONTROLLED WITH YOUR NAUSEA MEDICATION  *UNUSUAL SHORTNESS OF BREATH  *UNUSUAL BRUISING OR BLEEDING  TENDERNESS IN MOUTH AND THROAT WITH OR WITHOUT PRESENCE OF ULCERS  *URINARY PROBLEMS  *BOWEL PROBLEMS  UNUSUAL RASH Items with * indicate a potential emergency and should be followed up as soon as possible.  Feel free to call the clinic should you have any questions or concerns. The clinic phone number is [phone].  Please show the CHEMO ALERT CARD at check-in to the Emergency Department and triage nurse.   

## 2017-12-12 NOTE — Progress Notes (Signed)
[facility] OFFICE PROGRESS NOTE  Willey Blade, MD [address] [place] [address]  DIAGNOSIS: High-grade muscle invasive bladder cancer  PRIOR THERAPY: TURBT in January 2019  CURRENT THERAPY: Cisplatin and Gemzar.  First dose started on 12/05/2017.  INTERVAL HISTORY: Nathaniel Kirk 72 y.o. male returns for routine follow-up visit.  The patient received his first cycle of chemotherapy last week.  He tolerated fairly well with the exception of fatigue.  He reported one episode of nausea and used his antiemetics.  He had no vomiting.  Patient denies fevers and chills.  Denies chest pain, shortness of breath, cough, hemoptysis.  Denies constipation or diarrhea.  Denies recent weight loss or night sweats.  The patient denies headaches, blurred vision, syncope, seizures.  Denies skeletal complaints.  Denies urinary frequency, urgency, hematuria.  Denies skin rashes and lesions.  The remaining review of systems is negative.  The patient is here for evaluation prior to day 8 of cycle 1 of his chemotherapy.  MEDICAL HISTORY: Past Medical History:  Diagnosis Date  . Arthritis    knees  . Bladder tumor    removed 08/31/17  post op bleeding   . 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 surgery scheduled with Dr. Bridgett Larsson 09-10-17  . Pneumonia    walking PNA  2016 or 2017  . UTI (urinary tract infection)     ALLERGIES:  has No Known Allergies.  MEDICATIONS:  Current Outpatient Medications  Medication Sig Dispense Refill  . cetirizine-pseudoephedrine (ZYRTEC-D) 5-120 MG tablet Take 1 tablet by mouth 2 (two) times daily as needed for allergies.    Marland Kitchen lidocaine-prilocaine (EMLA) cream Apply 1 application topically as needed. 30 g 0  . naproxen sodium (ALEVE) 220 MG tablet Take 220 mg by mouth 2 (two) times daily as needed (for knee pain/arthritis pain.).     Marland Kitchen prochlorperazine (COMPAZINE) 10 MG tablet Take 1 tablet (10 mg total)  by mouth every 6 (six) hours as needed for nausea or vomiting. 30 tablet 0   No current facility-administered medications for this visit.     SURGICAL HISTORY:  Past Surgical History:  Procedure Laterality Date  . ABDOMINAL AORTIC ENDOVASCULAR STENT GRAFT N/A 09/10/2017   Procedure: ABDOMINAL AORTIC ENDOVASCULAR STENT GRAFT WITH RIGHT AND  LEFT ILIAC BRANCH EXTENSIONS;  Surgeon: Conrad [place], MD;  Location: [facility];  Service: Vascular;  Laterality: N/A;  . COLON SURGERY     diverticulitis foot of colon out  . CYSTOSCOPY W/ RETROGRADES Bilateral 08/31/2017   Procedure: CYSTOSCOPY WITH RETROGRADE PYELOGRAM;  Surgeon: Cleon Gustin, MD;  Location: [facility];  Service: Urology;  Laterality: Bilateral;  . HERNIA REPAIR     umbilical  . IR FLUORO GUIDE PORT INSERTION RIGHT  12/11/2017  . IR US GUIDE VASC ACCESS RIGHT  12/11/2017  . TONSILLECTOMY     as kid  . TRANSURETHRAL RESECTION OF BLADDER TUMOR N/A 08/31/2017   Procedure: TRANSURETHRAL RESECTION OF BLADDER TUMOR (TURBT);  Surgeon: Cleon Gustin, MD;  Location: [facility];  Service: Urology;  Laterality: N/A;    REVIEW OF SYSTEMS:   Review of Systems  Constitutional: Negative for appetite change, chills, fever and unexpected weight change. Positive for fatigue. HENT:   Negative for mouth sores, nosebleeds, sore throat and trouble swallowing.   Eyes: Negative for eye problems and icterus.  Respiratory: Negative for cough, hemoptysis, shortness of breath and wheezing.   Cardiovascular: Negative for chest pain  and leg swelling.  Gastrointestinal: Negative for abdominal pain, constipation, diarrhea, nausea and vomiting.  Genitourinary: Negative for bladder incontinence, difficulty urinating, dysuria, frequency and hematuria.   Musculoskeletal: Negative for back pain, gait problem, neck pain and neck stiffness.  Skin: Negative for itching and rash.  Neurological: Negative for dizziness, extremity weakness, gait problem, headaches,  light-headedness and seizures.  Hematological: Negative for adenopathy. Does not bruise/bleed easily.  Psychiatric/Behavioral: Negative for confusion, depression and sleep disturbance. The patient is not nervous/anxious.     PHYSICAL EXAMINATION:  Blood pressure 132/70, pulse (!) 53, temperature 98.5 F (36.9 C), temperature source Oral, resp. rate 18, height 5' 9.5" (1.765 m), weight 251 lb 9.6 oz (114.1 kg), SpO2 100 %.  ECOG PERFORMANCE STATUS: 1 - Symptomatic but completely ambulatory  Physical Exam  Constitutional: Oriented to person, place, and time and well-developed, well-nourished, and in no distress. No distress.  HENT:  Head: Normocephalic and atraumatic.  Mouth/Throat: Oropharynx is clear and moist. No oropharyngeal exudate.  Eyes: Conjunctivae are normal. Right eye exhibits no discharge. Left eye exhibits no discharge. No scleral icterus.  Neck: Normal range of motion. Neck supple.  Cardiovascular: Normal rate, regular rhythm, normal heart sounds and intact distal pulses.   Pulmonary/Chest: Effort normal and breath sounds normal. No respiratory distress. No wheezes. No rales.  Abdominal: Soft. Bowel sounds are normal. Exhibits no distension and no mass. There is no tenderness.  Musculoskeletal: Normal range of motion. Exhibits no edema.  Lymphadenopathy:    No cervical adenopathy.  Neurological: Alert and oriented to person, place, and time. Exhibits normal muscle tone. Gait normal. Coordination normal.  Skin: Skin is warm and dry. No rash noted. Not diaphoretic. No erythema. No pallor.  Psychiatric: Mood, memory and judgment normal.  Vitals reviewed.  LABORATORY DATA: Lab Results  Component Value Date   WBC 6.3 12/11/2017   HGB 15.0 12/11/2017   HCT 43.8 12/11/2017   MCV 92.6 12/11/2017   PLT 130 (L) 12/11/2017      Chemistry      Component Value Date/Time   NA 140 12/05/2017 0743   K 4.0 12/05/2017 0743   CL 110 (H) 12/05/2017 0743   CO2 24 12/05/2017 0743    BUN 24 12/05/2017 0743   CREATININE 0.96 12/05/2017 0743      Component Value Date/Time   CALCIUM 9.3 12/05/2017 0743   ALKPHOS 71 12/05/2017 0743   AST 16 12/05/2017 0743   ALT 14 12/05/2017 0743   BILITOT 0.4 12/05/2017 0743       RADIOGRAPHIC STUDIES:  Ir US Guide Vasc Access Right  Result Date: 12/11/2017 CLINICAL DATA:  Bladder carcinoma and need for porta cath for chemotherapy. EXAM: IMPLANTED PORT A CATH PLACEMENT WITH ULTRASOUND AND FLUOROSCOPIC GUIDANCE ANESTHESIA/SEDATION: 3.0 mg IV Versed; 100 mcg IV Fentanyl Total Moderate Sedation Time:  30 minutes The patient's level of consciousness and physiologic status were continuously monitored during the procedure by Radiology nursing. Additional Medications: 2 g IV Ancef. FLUOROSCOPY TIME:  18 seconds.  9.0 mGy. PROCEDURE: The procedure, risks, benefits, and alternatives were explained to the patient. Questions regarding the procedure were encouraged and answered. The patient understands and consents to the procedure. A time-out was performed prior to initiating the procedure. Ultrasound was utilized to confirm patency of the right internal jugular vein. The right neck and chest were prepped with chlorhexidine in a sterile fashion, and a sterile drape was applied covering the operative field. Maximum barrier sterile technique with sterile gowns and gloves were used  for the procedure. Local anesthesia was provided with 1% lidocaine. After creating a small venotomy incision, a 21 gauge needle was advanced into the right internal jugular vein under direct, real-time ultrasound guidance. Ultrasound image documentation was performed. After securing guidewire access, an 8 Fr dilator was placed. A J-wire was kinked to measure appropriate catheter length. A subcutaneous port pocket was then created along the upper chest wall utilizing sharp and blunt dissection. Portable cautery was utilized. The pocket was irrigated with sterile saline. A single  lumen power injectable port was chosen for placement. The 8 Fr catheter was tunneled from the port pocket site to the venotomy incision. The port was placed in the pocket. External catheter was trimmed to appropriate length based on guidewire measurement. At the venotomy, an 8 Fr peel-away sheath was placed over a guidewire. The catheter was then placed through the sheath and the sheath removed. Final catheter positioning was confirmed and documented with a fluoroscopic spot image. The port was accessed with a needle and aspirated and flushed with heparinized saline. The access needle was removed. The venotomy and port pocket incisions were closed with subcutaneous 3-0 Monocryl and subcuticular 4-0 Vicryl. Dermabond was applied to both incisions. COMPLICATIONS: COMPLICATIONS None FINDINGS: After catheter placement, the tip lies at the cavo-atrial junction. The catheter aspirates normally and is ready for immediate use. IMPRESSION: Placement of single lumen port a cath via right internal jugular vein. The catheter tip lies at the cavo-atrial junction. A power injectable port a cath was placed and is ready for immediate use. Electronically Signed   By: Aletta Edouard M.D.   On: 12/11/2017 16:04   Ir Fluoro Guide Port Insertion Right  Result Date: 12/11/2017 CLINICAL DATA:  Bladder carcinoma and need for porta cath for chemotherapy. EXAM: IMPLANTED PORT A CATH PLACEMENT WITH ULTRASOUND AND FLUOROSCOPIC GUIDANCE ANESTHESIA/SEDATION: 3.0 mg IV Versed; 100 mcg IV Fentanyl Total Moderate Sedation Time:  30 minutes The patient's level of consciousness and physiologic status were continuously monitored during the procedure by Radiology nursing. Additional Medications: 2 g IV Ancef. FLUOROSCOPY TIME:  18 seconds.  9.0 mGy. PROCEDURE: The procedure, risks, benefits, and alternatives were explained to the patient. Questions regarding the procedure were encouraged and answered. The patient understands and consents to the  procedure. A time-out was performed prior to initiating the procedure. Ultrasound was utilized to confirm patency of the right internal jugular vein. The right neck and chest were prepped with chlorhexidine in a sterile fashion, and a sterile drape was applied covering the operative field. Maximum barrier sterile technique with sterile gowns and gloves were used for the procedure. Local anesthesia was provided with 1% lidocaine. After creating a small venotomy incision, a 21 gauge needle was advanced into the right internal jugular vein under direct, real-time ultrasound guidance. Ultrasound image documentation was performed. After securing guidewire access, an 8 Fr dilator was placed. A J-wire was kinked to measure appropriate catheter length. A subcutaneous port pocket was then created along the upper chest wall utilizing sharp and blunt dissection. Portable cautery was utilized. The pocket was irrigated with sterile saline. A single lumen power injectable port was chosen for placement. The 8 Fr catheter was tunneled from the port pocket site to the venotomy incision. The port was placed in the pocket. External catheter was trimmed to appropriate length based on guidewire measurement. At the venotomy, an 8 Fr peel-away sheath was placed over a guidewire. The catheter was then placed through the sheath and the sheath removed. Final  catheter positioning was confirmed and documented with a fluoroscopic spot image. The port was accessed with a needle and aspirated and flushed with heparinized saline. The access needle was removed. The venotomy and port pocket incisions were closed with subcutaneous 3-0 Monocryl and subcuticular 4-0 Vicryl. Dermabond was applied to both incisions. COMPLICATIONS: COMPLICATIONS None FINDINGS: After catheter placement, the tip lies at the cavo-atrial junction. The catheter aspirates normally and is ready for immediate use. IMPRESSION: Placement of single lumen port a cath via right  internal jugular vein. The catheter tip lies at the cavo-atrial junction. A power injectable port a cath was placed and is ready for immediate use. Electronically Signed   By: Aletta Edouard M.D.   On: 12/11/2017 16:04     ASSESSMENT/PLAN:  72 year old gentleman with the following issues:  1.  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  CT scan obtained in April 2019 showed mild adenopathy that are suspicious for metastatic disease versus reactive findings.  He had a PET scan performed on 11/29/2017 at [facility] which showed mildly enlarged pelvic and retroperitoneal lymph nodes with mild FDG avidity; these findings are nonspecific, and can be reactive in the postsurgical setting, although nodal metastatic disease is not excluded; recommend attention on follow-up.  There also interval stable scattered pulmonary nodules measuring up to 6 mm below the resolution for PET imaging; attention is recommended on follow-up.  The patient also underwent a biopsy of 1 of his pelvic lymph nodes which was positive for metastatic disease.  The patient was seen with Dr Alen Blew.  Dr.discussed the PET scan results and biopsy results with the patient and his daughter.  Discussed with the patient that he has stage IV disease.  Recommend the patient undergo 6 cycles of neoadjuvant chemotherapy followed by surgery if he has a good response to chemotherapy.  The patient is currently on cisplatin and Gemzar.  He tolerated the first dose well with the exception of fatigue.  He has mild thrombocytopenia with a platelet count of 94,000.  Recommend for him to proceed with day 8 of cycle 1 of his treatment.  2.  IV access: Port-A-Cath in place.  He has no complications to his Port-A-Cath.  3.  Antiemetics: The patient has Compazine available to him.  4.  Renal function surveillance: Creatinine has increased to 1.62.  Okay to proceed with treatment as planned today.  We will monitor his  creatinine closely while on treatment. We will avoid nephrotoxic drugs including nonsteroidal anti-inflammatories.  5.  Follow-up: In 2 weeks for evaluation prior to day 1 of cycle 2.  No orders of the defined types were placed in this encounter.  Mikey Bussing, DNP, AGPCNP-BC, AOCNP 12/12/17  Patient seen and examined personally today.  Results of the PET scan and the biopsy were reviewed with the patient today.  His case was discussed with his oncologist at [facility].  The plan is to proceed with systemic chemotherapy for total of 6 cycles and repeat imaging studies after that.  She has a complete response to therapy, curative cystectomy will be attempted.  He is ready to proceed with day 8 of cycle 1.  Cycle 2 will start in 2 weeks.  All his questions were answered today to his satisfaction.  Zola Button MD 12/12/17

## 2017-12-27 ENCOUNTER — Inpatient Hospital Stay: Payer: Medicare Other

## 2017-12-27 ENCOUNTER — Telehealth: Payer: Self-pay | Admitting: Oncology

## 2017-12-27 ENCOUNTER — Inpatient Hospital Stay: Payer: Medicare Other | Attending: Oncology | Admitting: Oncology

## 2017-12-27 VITALS — BP 135/74 | HR 59 | Temp 98.1°F | Resp 17 | Ht 69.5 in | Wt 256.4 lb

## 2017-12-27 DIAGNOSIS — C679 Malignant neoplasm of bladder, unspecified: Secondary | ICD-10-CM | POA: Diagnosis not present

## 2017-12-27 DIAGNOSIS — C775 Secondary and unspecified malignant neoplasm of intrapelvic lymph nodes: Secondary | ICD-10-CM

## 2017-12-27 DIAGNOSIS — D709 Neutropenia, unspecified: Secondary | ICD-10-CM

## 2017-12-27 DIAGNOSIS — R350 Frequency of micturition: Secondary | ICD-10-CM | POA: Diagnosis not present

## 2017-12-27 DIAGNOSIS — Z5111 Encounter for antineoplastic chemotherapy: Secondary | ICD-10-CM | POA: Insufficient documentation

## 2017-12-27 DIAGNOSIS — M542 Cervicalgia: Secondary | ICD-10-CM | POA: Diagnosis not present

## 2017-12-27 DIAGNOSIS — G8929 Other chronic pain: Secondary | ICD-10-CM

## 2017-12-27 DIAGNOSIS — Z95828 Presence of other vascular implants and grafts: Secondary | ICD-10-CM

## 2017-12-27 LAB — CBC WITH DIFFERENTIAL (CANCER CENTER ONLY)
BASOS ABS: 0 10*3/uL (ref 0.0–0.1)
BASOS PCT: 1 %
Eosinophils Absolute: 0.1 10*3/uL (ref 0.0–0.5)
Eosinophils Relative: 2 %
HCT: 38.5 % (ref 38.4–49.9)
HEMOGLOBIN: 13.1 g/dL (ref 13.0–17.1)
Lymphocytes Relative: 34 %
Lymphs Abs: 1.2 10*3/uL (ref 0.9–3.3)
MCH: 31.3 pg (ref 27.2–33.4)
MCHC: 33.9 g/dL (ref 32.0–36.0)
MCV: 92.3 fL (ref 79.3–98.0)
MONOS PCT: 17 %
Monocytes Absolute: 0.6 10*3/uL (ref 0.1–0.9)
NEUTROS PCT: 46 %
Neutro Abs: 1.6 10*3/uL (ref 1.5–6.5)
Platelet Count: 297 10*3/uL (ref 140–400)
RBC: 4.17 MIL/uL — AB (ref 4.20–5.82)
RDW: 13.3 % (ref 11.0–14.6)
WBC: 3.5 10*3/uL — AB (ref 4.0–10.3)

## 2017-12-27 LAB — CMP (CANCER CENTER ONLY)
ALBUMIN: 4 g/dL (ref 3.5–5.0)
ALK PHOS: 80 U/L (ref 40–150)
ALT: 8 U/L (ref 0–55)
AST: 16 U/L (ref 5–34)
Anion gap: 9 (ref 3–11)
BUN: 22 mg/dL (ref 7–26)
CALCIUM: 9.2 mg/dL (ref 8.4–10.4)
CO2: 24 mmol/L (ref 22–29)
Chloride: 105 mmol/L (ref 98–109)
Creatinine: 1.36 mg/dL — ABNORMAL HIGH (ref 0.70–1.30)
GFR, Est AFR Am: 58 mL/min — ABNORMAL LOW (ref >60)
GFR, Estimated: 50 mL/min — ABNORMAL LOW (ref >60)
GLUCOSE: 99 mg/dL (ref 70–140)
Potassium: 4.7 mmol/L (ref 3.5–5.1)
SODIUM: 138 mmol/L (ref 136–145)
TOTAL PROTEIN: 7 g/dL (ref 6.4–8.3)

## 2017-12-27 MED ORDER — PALONOSETRON HCL INJECTION 0.25 MG/5ML
0.2500 mg | Freq: Once | INTRAVENOUS | Status: AC
  Administered 2017-12-27: 0.25 mg via INTRAVENOUS

## 2017-12-27 MED ORDER — POTASSIUM CHLORIDE 2 MEQ/ML IV SOLN
Freq: Once | INTRAVENOUS | Status: AC
  Administered 2017-12-27: 09:00:00 via INTRAVENOUS
  Filled 2017-12-27: qty 10

## 2017-12-27 MED ORDER — SODIUM CHLORIDE 0.9% FLUSH
10.0000 mL | INTRAVENOUS | Status: DC | PRN
  Administered 2017-12-27: 10 mL
  Filled 2017-12-27: qty 10

## 2017-12-27 MED ORDER — PALONOSETRON HCL INJECTION 0.25 MG/5ML
INTRAVENOUS | Status: AC
  Filled 2017-12-27: qty 5

## 2017-12-27 MED ORDER — SODIUM CHLORIDE 0.9 % IV SOLN
1000.0000 mg/m2 | Freq: Once | INTRAVENOUS | Status: AC
  Administered 2017-12-27: 2356 mg via INTRAVENOUS
  Filled 2017-12-27: qty 61.96

## 2017-12-27 MED ORDER — FOSAPREPITANT DIMEGLUMINE INJECTION 150 MG
Freq: Once | INTRAVENOUS | Status: AC
  Administered 2017-12-27: 12:00:00 via INTRAVENOUS
  Filled 2017-12-27: qty 5

## 2017-12-27 MED ORDER — HEPARIN SOD (PORK) LOCK FLUSH 100 UNIT/ML IV SOLN
500.0000 [IU] | Freq: Once | INTRAVENOUS | Status: AC | PRN
  Administered 2017-12-27: 500 [IU]
  Filled 2017-12-27: qty 5

## 2017-12-27 MED ORDER — SODIUM CHLORIDE 0.9 % IV SOLN
Freq: Once | INTRAVENOUS | Status: AC
  Administered 2017-12-27: 09:00:00 via INTRAVENOUS

## 2017-12-27 MED ORDER — SODIUM CHLORIDE 0.9 % IV SOLN
50.0000 mg/m2 | Freq: Once | INTRAVENOUS | Status: AC
  Administered 2017-12-27: 119 mg via INTRAVENOUS
  Filled 2017-12-27: qty 119

## 2017-12-27 NOTE — Patient Instructions (Signed)
[facility] Discharge Instructions for Patients Receiving Chemotherapy  Today you received the following chemotherapy agent: cisplatin (Platinol) and gemcitabine (Gemzar).   To help prevent nausea and vomiting after your treatment, we encourage you to take your nausea medication as prescribed.   If you develop nausea and vomiting that is not controlled by your nausea medication, call the clinic.   BELOW ARE SYMPTOMS THAT SHOULD BE REPORTED IMMEDIATELY:  *FEVER GREATER THAN 100.5 F  *CHILLS WITH OR WITHOUT FEVER  NAUSEA AND VOMITING THAT IS NOT CONTROLLED WITH YOUR NAUSEA MEDICATION  *UNUSUAL SHORTNESS OF BREATH  *UNUSUAL BRUISING OR BLEEDING  TENDERNESS IN MOUTH AND THROAT WITH OR WITHOUT PRESENCE OF ULCERS  *URINARY PROBLEMS  *BOWEL PROBLEMS  UNUSUAL RASH Items with * indicate a potential emergency and should be followed up as soon as possible.  Feel free to call the clinic should you have any questions or concerns. The clinic phone number is [phone].  Please show the [facility] at check-in to the Emergency Department and triage nurse.

## 2017-12-27 NOTE — Progress Notes (Signed)
Hematology and Oncology Follow Up Visit  Nathaniel Kirk [id number] 1945-09-17 72 y.o. 12/27/2017 8:05 AM Willey Blade, MDShelton, [name], MD   Principle Diagnosis: 72 year old gentleman with high-grade urothelial carcinoma with muscle invasion diagnosed in February 2019.  His staging work-up showed stage IVa with a disease in the retroperitoneal lymph node that is biopsy-proven.   Prior Therapy: He is status post TURBT January 2019 and repeated in October 22, 2017.  Pathology showed high-grade urothelial carcinoma with muscle invasion indicating at least T2 disease.  Current therapy: He is receiving neoadjuvant chemotherapy started in Dec 05, 2017.  He is here for day 1 cycle 2 of therapy.  Interim History: Nathaniel Kirk presents today for a follow-up visit.  Since the last visit, he tolerated the first cycle of chemotherapy without complications.  He denies any nausea, vomiting or worsening neuropathy.  He denies any excessive fatigue or tiredness.  He denies any infusion related complications.  He does have frequency of urination which has not changed at this time.  He continues to attend to activities of daily living without any decline.  He does report neck pain which is chronic in nature but no other bone pain.  He does not report any headaches, blurry vision, syncope or seizures. Does not report any fevers, chills or sweats.  Does not report any cough, wheezing or hemoptysis.  Does not report any chest pain, palpitation, orthopnea or leg edema.  Does not report any nausea, vomiting or abdominal pain.  Does not report any constipation or diarrhea.  Does not report any skeletal complaints.    Does not report frequency, urgency or hematuria.  Does not report any skin rashes or lesions. Does not report any heat or cold intolerance.  Does not report any lymphadenopathy or petechiae.  Does not report any anxiety or depression.  Remaining review of systems is negative.    Medications: I have reviewed  the patient's current medications.  Current Outpatient Medications  Medication Sig Dispense Refill  . cetirizine-pseudoephedrine (ZYRTEC-D) 5-120 MG tablet Take 1 tablet by mouth 2 (two) times daily as needed for allergies.    Marland Kitchen lidocaine-prilocaine (EMLA) cream Apply 1 application topically as needed. 30 g 0  . naproxen sodium (ALEVE) 220 MG tablet Take 220 mg by mouth 2 (two) times daily as needed (for knee pain/arthritis pain.).     Marland Kitchen prochlorperazine (COMPAZINE) 10 MG tablet Take 1 tablet (10 mg total) by mouth every 6 (six) hours as needed for nausea or vomiting. 30 tablet 0   No current facility-administered medications for this visit.    Facility-Administered Medications Ordered in Other Visits  Medication Dose Route Frequency Provider Last Rate Last Dose  . sodium chloride flush (NS) 0.9 % injection 10 mL  10 mL Intracatheter PRN Wyatt Portela, MD   10 mL at 12/27/17 0759     Allergies: No Known Allergies  Past Medical History, Surgical history, Social history, and Family History were reviewed and updated.    Physical Exam: Blood pressure 135/74, pulse (!) 59, temperature 98.1 F (36.7 C), temperature source Oral, resp. rate 17, height 5' 9.5" (1.765 m), weight 256 lb 6.4 oz (116.3 kg), SpO2 99 %.   ECOG: 0 General appearance: alert and cooperative appeared without distress. Head: Normocephalic, without obvious abnormality Oropharynx: No oral thrush or ulcers. Eyes: No scleral icterus.  Pupils are equal and round reactive to light. Lymph nodes: Cervical, supraclavicular, and axillary nodes normal. Heart:regular rate and rhythm, S1, S2 normal, no murmur, click, rub  or gallop Lung:chest clear, no wheezing, rales, normal symmetric air entry Abdomin: soft, non-tender, without masses or organomegaly. Neurological: No motor, sensory deficits.  Intact deep tendon reflexes. Skin: No rashes or lesions.  No ecchymosis or petechiae. Musculoskeletal: No joint deformity or  effusion. Psychiatric: Mood and affect are appropriate.    Lab Results: Lab Results  Component Value Date   WBC 6.1 12/12/2017   HGB 14.0 12/12/2017   HCT 41.9 12/12/2017   MCV 92.8 12/12/2017   PLT 94 (L) 12/12/2017     Chemistry      Component Value Date/Time   NA 137 12/12/2017 1146   K 4.7 12/12/2017 1146   CL 106 12/12/2017 1146   CO2 25 12/12/2017 1146   BUN 33 (H) 12/12/2017 1146   CREATININE 1.62 (H) 12/12/2017 1146      Component Value Date/Time   CALCIUM 9.3 12/12/2017 1146   ALKPHOS 70 12/12/2017 1146   AST 18 12/12/2017 1146   ALT 23 12/12/2017 1146   BILITOT 0.4 12/12/2017 1146       Impression and Plan:   72 year old man with the:  1.  High-grade urothelial carcinoma of the bladder diagnosed in February 2019.    His staging work-up revealed stage IVa disease with biopsy-proven retroperitoneal adenopathy.  He is currently receiving neoadjuvant chemotherapy for potentially curative bladder cancer.  He has completed cycle 1 of chemotherapy without any major complications.  His creatinine did increase slightly after day 1 of cycle 1 of therapy.  Risks and benefits of continuing chemotherapy at this time was reviewed today.  Long-term complications including worsening nausea vomiting, neuropathy and renal insufficiency were reviewed.  He is agreeable to continue and the plan is to complete 6 cycles of therapy if he is able to handle it.  Cisplatin doses may be adjusted depending on his creatinine which will be repeated today.  2.  IV access: Port-A-Cath remains in use without any complications.  3.  Antiemetics: Used Compazine once but no issues noted beyond that.  He does not take any nausea medication at this time.  4.  Renal function surveillance: Creatinine will be monitored closely and cisplatin dose needs to be adjusted accordingly.  6.  Neutropenia: His ANC is adequate and will proceed with chemotherapy today without any dose reduction or delay.   Growth factor support may be needed in the future.  7.  Follow-up: We will be in the immediate future to start chemotherapy.  25 minutes was spent with the patient face-to-face today.  More than 50% of time was dedicated to patient counseling, education and discussing future plan of care.      Zola Button, MD 6/6/20198:05 AM

## 2017-12-27 NOTE — Telephone Encounter (Signed)
Scheduled appt per 6/6 los - gave patient aVS and calender per los.  

## 2017-12-27 NOTE — Progress Notes (Signed)
Per Dr. Alen Blew, okay to proceed with cisplatin despite 165mL output.

## 2018-01-03 ENCOUNTER — Inpatient Hospital Stay: Payer: Medicare Other

## 2018-01-03 VITALS — BP 149/80 | HR 57 | Temp 98.4°F | Resp 18

## 2018-01-03 DIAGNOSIS — Z5111 Encounter for antineoplastic chemotherapy: Secondary | ICD-10-CM | POA: Diagnosis not present

## 2018-01-03 DIAGNOSIS — C679 Malignant neoplasm of bladder, unspecified: Secondary | ICD-10-CM

## 2018-01-03 DIAGNOSIS — Z95828 Presence of other vascular implants and grafts: Secondary | ICD-10-CM

## 2018-01-03 LAB — CBC WITH DIFFERENTIAL (CANCER CENTER ONLY)
BASOS ABS: 0 10*3/uL (ref 0.0–0.1)
BASOS PCT: 0 %
EOS PCT: 0 %
Eosinophils Absolute: 0 10*3/uL (ref 0.0–0.5)
HCT: 39 % (ref 38.4–49.9)
Hemoglobin: 13.2 g/dL (ref 13.0–17.1)
Lymphocytes Relative: 32 %
Lymphs Abs: 2 10*3/uL (ref 0.9–3.3)
MCH: 31.5 pg (ref 27.2–33.4)
MCHC: 33.8 g/dL (ref 32.0–36.0)
MCV: 93.1 fL (ref 79.3–98.0)
Monocytes Absolute: 0.3 10*3/uL (ref 0.1–0.9)
Monocytes Relative: 5 %
NEUTROS ABS: 4 10*3/uL (ref 1.5–6.5)
Neutrophils Relative %: 63 %
PLATELETS: 203 10*3/uL (ref 140–400)
RBC: 4.19 MIL/uL — AB (ref 4.20–5.82)
RDW: 13.5 % (ref 11.0–14.6)
WBC: 6.3 10*3/uL (ref 4.0–10.3)

## 2018-01-03 LAB — CMP (CANCER CENTER ONLY)
ALBUMIN: 4.1 g/dL (ref 3.5–5.0)
ALT: 12 U/L (ref 0–55)
AST: 18 U/L (ref 5–34)
Alkaline Phosphatase: 75 U/L (ref 40–150)
Anion gap: 7 (ref 3–11)
BUN: 31 mg/dL — AB (ref 7–26)
CHLORIDE: 104 mmol/L (ref 98–109)
CO2: 25 mmol/L (ref 22–29)
CREATININE: 1.69 mg/dL — AB (ref 0.70–1.30)
Calcium: 9.4 mg/dL (ref 8.4–10.4)
GFR, EST NON AFRICAN AMERICAN: 39 mL/min — AB (ref >60)
GFR, Est AFR Am: 45 mL/min — ABNORMAL LOW (ref >60)
GLUCOSE: 130 mg/dL (ref 70–140)
POTASSIUM: 4.4 mmol/L (ref 3.5–5.1)
SODIUM: 136 mmol/L (ref 136–145)
Total Bilirubin: 0.3 mg/dL (ref 0.2–1.2)
Total Protein: 7 g/dL (ref 6.4–8.3)

## 2018-01-03 MED ORDER — PROCHLORPERAZINE MALEATE 10 MG PO TABS
ORAL_TABLET | ORAL | Status: AC
  Filled 2018-01-03: qty 1

## 2018-01-03 MED ORDER — SODIUM CHLORIDE 0.9% FLUSH
10.0000 mL | INTRAVENOUS | Status: DC | PRN
  Administered 2018-01-03: 10 mL
  Filled 2018-01-03: qty 10

## 2018-01-03 MED ORDER — SODIUM CHLORIDE 0.9 % IV SOLN
1000.0000 mg/m2 | Freq: Once | INTRAVENOUS | Status: AC
  Administered 2018-01-03: 2356 mg via INTRAVENOUS
  Filled 2018-01-03: qty 61.96

## 2018-01-03 MED ORDER — PROCHLORPERAZINE MALEATE 10 MG PO TABS
10.0000 mg | ORAL_TABLET | Freq: Once | ORAL | Status: AC
  Administered 2018-01-03: 10 mg via ORAL

## 2018-01-03 MED ORDER — SODIUM CHLORIDE 0.9 % IV SOLN
Freq: Once | INTRAVENOUS | Status: AC
  Administered 2018-01-03: 13:00:00 via INTRAVENOUS

## 2018-01-03 MED ORDER — HEPARIN SOD (PORK) LOCK FLUSH 100 UNIT/ML IV SOLN
500.0000 [IU] | Freq: Once | INTRAVENOUS | Status: AC | PRN
  Administered 2018-01-03: 500 [IU]
  Filled 2018-01-03: qty 5

## 2018-01-03 NOTE — Progress Notes (Signed)
Per Dr. Alen Blew okay to treat pt with Crt of 1.69

## 2018-01-03 NOTE — Patient Instructions (Signed)
[facility] °Discharge Instructions for Patients Receiving Chemotherapy ° °Today you received the following chemotherapy agents Gemzar ° °To help prevent nausea and vomiting after your treatment, we encourage you to take your nausea medication as directed. °  °If you develop nausea and vomiting that is not controlled by your nausea medication, call the clinic.  ° °BELOW ARE SYMPTOMS THAT SHOULD BE REPORTED IMMEDIATELY: °· *FEVER GREATER THAN 100.5 F °· *CHILLS WITH OR WITHOUT FEVER °· NAUSEA AND VOMITING THAT IS NOT CONTROLLED WITH YOUR NAUSEA MEDICATION °· *UNUSUAL SHORTNESS OF BREATH °· *UNUSUAL BRUISING OR BLEEDING °· TENDERNESS IN MOUTH AND THROAT WITH OR WITHOUT PRESENCE OF ULCERS °· *URINARY PROBLEMS °· *BOWEL PROBLEMS °· UNUSUAL RASH °Items with * indicate a potential emergency and should be followed up as soon as possible. ° °Feel free to call the clinic should you have any questions or concerns. The clinic phone number is [phone]. ° °Please show the CHEMO ALERT CARD at check-in to the Emergency Department and triage nurse. ° ° °

## 2018-01-17 ENCOUNTER — Inpatient Hospital Stay: Payer: Medicare Other

## 2018-01-17 ENCOUNTER — Telehealth: Payer: Self-pay | Admitting: Oncology

## 2018-01-17 ENCOUNTER — Inpatient Hospital Stay (HOSPITAL_BASED_OUTPATIENT_CLINIC_OR_DEPARTMENT_OTHER): Payer: Medicare Other | Admitting: Oncology

## 2018-01-17 VITALS — BP 142/73 | HR 59 | Temp 98.2°F | Resp 17 | Ht 69.5 in | Wt 255.1 lb

## 2018-01-17 DIAGNOSIS — Z95828 Presence of other vascular implants and grafts: Secondary | ICD-10-CM

## 2018-01-17 DIAGNOSIS — C679 Malignant neoplasm of bladder, unspecified: Secondary | ICD-10-CM

## 2018-01-17 DIAGNOSIS — R351 Nocturia: Secondary | ICD-10-CM

## 2018-01-17 DIAGNOSIS — Z5111 Encounter for antineoplastic chemotherapy: Secondary | ICD-10-CM | POA: Diagnosis not present

## 2018-01-17 LAB — CBC WITH DIFFERENTIAL (CANCER CENTER ONLY)
Basophils Absolute: 0 10*3/uL (ref 0.0–0.1)
Basophils Relative: 0 %
Eosinophils Absolute: 0.1 10*3/uL (ref 0.0–0.5)
Eosinophils Relative: 3 %
HCT: 38.1 % — ABNORMAL LOW (ref 38.4–49.9)
HEMOGLOBIN: 12.7 g/dL — AB (ref 13.0–17.1)
Lymphocytes Relative: 37 %
Lymphs Abs: 1.5 10*3/uL (ref 0.9–3.3)
MCH: 31.2 pg (ref 27.2–33.4)
MCHC: 33.3 g/dL (ref 32.0–36.0)
MCV: 93.6 fL (ref 79.3–98.0)
Monocytes Absolute: 1 10*3/uL — ABNORMAL HIGH (ref 0.1–0.9)
Monocytes Relative: 23 %
NEUTROS PCT: 37 %
Neutro Abs: 1.6 10*3/uL (ref 1.5–6.5)
Platelet Count: 133 10*3/uL — ABNORMAL LOW (ref 140–400)
RBC: 4.07 MIL/uL — AB (ref 4.20–5.82)
RDW: 14.6 % (ref 11.0–14.6)
WBC: 4.2 10*3/uL (ref 4.0–10.3)

## 2018-01-17 LAB — CMP (CANCER CENTER ONLY)
ALBUMIN: 4 g/dL (ref 3.5–5.0)
ALK PHOS: 76 U/L (ref 38–126)
ALT: 12 U/L (ref 0–44)
AST: 15 U/L (ref 15–41)
Anion gap: 5 (ref 5–15)
BUN: 20 mg/dL (ref 8–23)
CALCIUM: 9.5 mg/dL (ref 8.9–10.3)
CO2: 27 mmol/L (ref 22–32)
CREATININE: 1.36 mg/dL — AB (ref 0.61–1.24)
Chloride: 103 mmol/L (ref 98–111)
GFR, EST AFRICAN AMERICAN: 58 mL/min — AB (ref >60)
GFR, Estimated: 50 mL/min — ABNORMAL LOW (ref >60)
GLUCOSE: 95 mg/dL (ref 70–99)
Potassium: 4.8 mmol/L (ref 3.5–5.1)
SODIUM: 135 mmol/L (ref 135–145)
Total Bilirubin: 0.3 mg/dL (ref 0.3–1.2)
Total Protein: 6.9 g/dL (ref 6.5–8.1)

## 2018-01-17 MED ORDER — SODIUM CHLORIDE 0.9 % IV SOLN
50.0000 mg/m2 | Freq: Once | INTRAVENOUS | Status: AC
  Administered 2018-01-17: 119 mg via INTRAVENOUS
  Filled 2018-01-17: qty 119

## 2018-01-17 MED ORDER — POTASSIUM CHLORIDE 2 MEQ/ML IV SOLN
Freq: Once | INTRAVENOUS | Status: AC
  Administered 2018-01-17: 10:00:00 via INTRAVENOUS
  Filled 2018-01-17: qty 10

## 2018-01-17 MED ORDER — SODIUM CHLORIDE 0.9% FLUSH
10.0000 mL | INTRAVENOUS | Status: DC | PRN
  Administered 2018-01-17: 10 mL
  Filled 2018-01-17: qty 10

## 2018-01-17 MED ORDER — HEPARIN SOD (PORK) LOCK FLUSH 100 UNIT/ML IV SOLN
500.0000 [IU] | Freq: Once | INTRAVENOUS | Status: AC | PRN
  Administered 2018-01-17: 500 [IU]
  Filled 2018-01-17: qty 5

## 2018-01-17 MED ORDER — SODIUM CHLORIDE 0.9 % IV SOLN
Freq: Once | INTRAVENOUS | Status: AC
  Administered 2018-01-17: 12:00:00 via INTRAVENOUS
  Filled 2018-01-17: qty 5

## 2018-01-17 MED ORDER — SODIUM CHLORIDE 0.9% FLUSH
10.0000 mL | INTRAVENOUS | Status: DC | PRN
Start: 2018-01-17 — End: 2018-01-17
  Administered 2018-01-17: 10 mL
  Filled 2018-01-17: qty 10

## 2018-01-17 MED ORDER — PALONOSETRON HCL INJECTION 0.25 MG/5ML
0.2500 mg | Freq: Once | INTRAVENOUS | Status: AC
  Administered 2018-01-17: 0.25 mg via INTRAVENOUS

## 2018-01-17 MED ORDER — SODIUM CHLORIDE 0.9 % IV SOLN
Freq: Once | INTRAVENOUS | Status: AC
  Administered 2018-01-17: 09:00:00 via INTRAVENOUS

## 2018-01-17 MED ORDER — PALONOSETRON HCL INJECTION 0.25 MG/5ML
INTRAVENOUS | Status: AC
  Filled 2018-01-17: qty 5

## 2018-01-17 MED ORDER — SODIUM CHLORIDE 0.9 % IV SOLN
1000.0000 mg/m2 | Freq: Once | INTRAVENOUS | Status: AC
  Administered 2018-01-17: 2356 mg via INTRAVENOUS
  Filled 2018-01-17: qty 61.96

## 2018-01-17 NOTE — Patient Instructions (Signed)
[facility] Discharge Instructions for Patients Receiving Chemotherapy  Today you received the following chemotherapy agent: Cisplatin and Gemzar.  To help prevent nausea and vomiting after your treatment, we encourage you to take your nausea medication as prescribed.   If you develop nausea and vomiting that is not controlled by your nausea medication, call the clinic.   BELOW ARE SYMPTOMS THAT SHOULD BE REPORTED IMMEDIATELY:  *FEVER GREATER THAN 100.5 F  *CHILLS WITH OR WITHOUT FEVER  NAUSEA AND VOMITING THAT IS NOT CONTROLLED WITH YOUR NAUSEA MEDICATION  *UNUSUAL SHORTNESS OF BREATH  *UNUSUAL BRUISING OR BLEEDING  TENDERNESS IN MOUTH AND THROAT WITH OR WITHOUT PRESENCE OF ULCERS  *URINARY PROBLEMS  *BOWEL PROBLEMS  UNUSUAL RASH Items with * indicate a potential emergency and should be followed up as soon as possible.  Feel free to call the clinic should you have any questions or concerns. The clinic phone number is [phone].  Please show the [facility] at check-in to the Emergency Department and triage nurse.

## 2018-01-17 NOTE — Progress Notes (Signed)
01/17/18 @ 1100  Patient CrCl 79 ml/min confirmed dose with Dr Alen Blew to continue Cisplatin at 50 mg/m2.  T.O. Dr Creta Levin, PharmD

## 2018-01-17 NOTE — Telephone Encounter (Signed)
Scheduled appt per 6/27 los - gave patient aVS and calender per los.  

## 2018-01-17 NOTE — Progress Notes (Signed)
Hematology and Oncology Follow Up Visit  Nathaniel Kirk [id number] Jan 29, 1946 72 y.o. 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  He has documented retroperitoneal lymph node involvement.  Prior Therapy: He is status post TURBT January 2019 and repeated in October 22, 2017.  Pathology showed high-grade urothelial carcinoma with muscle invasion indicating at least T2 disease.  Current therapy: He is receiving gemcitabine and cisplatin chemotherapy started in Dec 05, 2017.  He is here for the start of cycle 3 of therapy.  This plan was given a reduced dose because of increased creatinine.  Interim History: Nathaniel Kirk is here for a follow-up.  The last visit, he reports no major complications related to chemotherapy.  He did report some mild nausea and mild fatigue associated with the last cycle of chemotherapy.  He denies any vomiting or worsening neuropathy.  He denies any infusion related complications.  Continues to be active and attends to activities of daily living.  Still urinates freely without any dysuria or hematuria.  He does report nocturia and urgency at times.  His performance status and activity level is unchanged.  He does not report any headaches, blurry vision, syncope or seizures.  He denies any alteration in mental status, psychiatric issues or confusion.  Does not report any fevers, chills or sweats.  Does not report any cough, wheezing or hemoptysis.  Does not report any chest pain, palpitation, orthopnea or leg edema.  Does not report any nausea, vomiting or abdominal pain.    He denies any early satiety or change in his bowel habits.  Does not report any arthralgias or myalgias.   Does not report frequency, urgency or hematuria.  Does not report any skin rashes or lesions..  Does not report any lymphadenopathy or petechiae.  He does not  report any bleeding or clotting tendencies.  Remaining review of systems is negative.    Medications: I have reviewed the patient's current medications.  Current Outpatient Medications  Medication Sig Dispense Refill  . buPROPion (WELLBUTRIN) 100 MG tablet     . cetirizine-pseudoephedrine (ZYRTEC-D) 5-120 MG tablet Take 1 tablet by mouth 2 (two) times daily as needed for allergies.    Marland Kitchen lidocaine-prilocaine (EMLA) cream Apply 1 application topically as needed. 30 g 0  . naproxen sodium (ALEVE) 220 MG tablet Take 220 mg by mouth 2 (two) times daily as needed (for knee pain/arthritis pain.).     Marland Kitchen prochlorperazine (COMPAZINE) 10 MG tablet Take 1 tablet (10 mg total) by mouth every 6 (six) hours as needed for nausea or vomiting. 30 tablet 0   No current facility-administered medications for this visit.    Facility-Administered Medications Ordered in Other Visits  Medication Dose Route Frequency Provider Last Rate Last Dose  . sodium chloride flush (NS) 0.9 % injection 10 mL  10 mL Intracatheter PRN Wyatt Portela, MD   10 mL at 01/17/18 8469     Allergies: No Known Allergies  Past Medical History, Surgical history, Social history, and Family History were reviewed and updated.    Physical Exam:  Blood pressure (!) 142/73, pulse (!) 59, temperature 98.2 F (36.8 C), temperature source Oral, resp. rate 17, height 5' 9.5" (1.765 m), weight 255 lb 1.6 oz (115.7 kg), SpO2 97 %.   ECOG: 0 General appearance: Well-appearing gentleman without distress. Head: Atraumatic without abnormalities. Oropharynx: No oral thrush or ulcers. Eyes: Pupils are  equal and round reactive to light. Lymph nodes: No lymphadenopathy noted in the cervical, supraclavicular, or axillary nodes  Heart:regular rate and rhythm, without any murmurs or gallops.  No leg edema. Lung:clear, to auscultation without any rhonchi, wheezes or dullness to percussion. Abdomin: soft, without any rebound or guarding.  No shifting  dullness or ascites. Neurological: Intact motor, sensory exam.  Ambulates without any difficulties. Skin: No ecchymosis or petechiae. Musculoskeletal: No clubbing or cyanosis.     Lab Results: Lab Results  Component Value Date   WBC 6.3 01/03/2018   HGB 13.2 01/03/2018   HCT 39.0 01/03/2018   MCV 93.1 01/03/2018   PLT 203 01/03/2018     Chemistry      Component Value Date/Time   NA 136 01/03/2018 1118   K 4.4 01/03/2018 1118   CL 104 01/03/2018 1118   CO2 25 01/03/2018 1118   BUN 31 (H) 01/03/2018 1118   CREATININE 1.69 (H) 01/03/2018 1118      Component Value Date/Time   CALCIUM 9.4 01/03/2018 1118   ALKPHOS 75 01/03/2018 1118   AST 18 01/03/2018 1118   ALT 12 01/03/2018 1118   BILITOT 0.3 01/03/2018 1118       Impression and Plan:   72 year old man with the:  1.    Stage IVa high-grade urothelial carcinoma of the bladder diagnosed in February 2019.    He has retroperitoneal lymph node involvement..  He has completed 2 cycles of neoadjuvant chemotherapy utilizing cisplatin and gemcitabine.  Risks and benefits of continuing this regimen was reviewed today.  Long-term complication associated with it was reviewed.  And the plan is to proceed with same and schedule with possible dose modification of his cisplatin depending on his kidney function.  The plan is to complete 6 cycles of therapy prior to consideration for surgery.  2.  IV access: Port-A-Cath has been utilized without any complications.  3.  Antiemetics: No nausea or vomiting reported.  Antiemetics is available to him.  4.  Renal function surveillance: His creatinine clearance between 30 and 40 cc/min.  Cisplatin dose adjustment will be made accordingly.  He is currently receiving 50 mg/m that can be reduced to 35 mg/m depending on his creatinine clearance.  6.  Neutropenia: His absolute neutrophil count is adequate today and will proceed with chemotherapy.  7.  Follow-up: We will be in 1 week for  day 8 of cycle 3 and an 3 weeks for the beginning of the next cycle of therapy.  25 minutes was spent with the patient face-to-face today.  More than 50% of time was dedicated to patient counseling, education and discussing future plan of care.      Zola Button, MD 6/27/20198:28 AM

## 2018-01-22 ENCOUNTER — Other Ambulatory Visit: Payer: Self-pay | Admitting: Oncology

## 2018-01-25 ENCOUNTER — Inpatient Hospital Stay: Payer: Medicare Other | Attending: Oncology

## 2018-01-25 ENCOUNTER — Inpatient Hospital Stay: Payer: Medicare Other

## 2018-01-25 ENCOUNTER — Other Ambulatory Visit: Payer: Self-pay | Admitting: Hematology

## 2018-01-25 VITALS — BP 150/81 | HR 63 | Temp 97.7°F | Resp 18

## 2018-01-25 DIAGNOSIS — C679 Malignant neoplasm of bladder, unspecified: Secondary | ICD-10-CM | POA: Insufficient documentation

## 2018-01-25 DIAGNOSIS — Z95828 Presence of other vascular implants and grafts: Secondary | ICD-10-CM

## 2018-01-25 DIAGNOSIS — Z5111 Encounter for antineoplastic chemotherapy: Secondary | ICD-10-CM | POA: Insufficient documentation

## 2018-01-25 LAB — COMPREHENSIVE METABOLIC PANEL
ALBUMIN: 4.2 g/dL (ref 3.5–5.0)
ALK PHOS: 80 U/L (ref 38–126)
ALT: 14 U/L (ref 0–44)
AST: 18 U/L (ref 15–41)
Anion gap: 7 (ref 5–15)
BILIRUBIN TOTAL: 0.2 mg/dL — AB (ref 0.3–1.2)
BUN: 28 mg/dL — AB (ref 8–23)
CALCIUM: 9.6 mg/dL (ref 8.9–10.3)
CO2: 25 mmol/L (ref 22–32)
Chloride: 104 mmol/L (ref 98–111)
Creatinine, Ser: 1.87 mg/dL — ABNORMAL HIGH (ref 0.61–1.24)
GFR calc Af Amer: 39 mL/min — ABNORMAL LOW (ref >60)
GFR calc non Af Amer: 34 mL/min — ABNORMAL LOW (ref >60)
GLUCOSE: 99 mg/dL (ref 70–99)
Potassium: 5.2 mmol/L — ABNORMAL HIGH (ref 3.5–5.1)
SODIUM: 136 mmol/L (ref 135–145)
TOTAL PROTEIN: 7.1 g/dL (ref 6.5–8.1)

## 2018-01-25 LAB — CBC WITH DIFFERENTIAL (CANCER CENTER ONLY)
BASOS ABS: 0 10*3/uL (ref 0.0–0.1)
BASOS PCT: 1 %
EOS ABS: 0 10*3/uL (ref 0.0–0.5)
Eosinophils Relative: 1 %
HEMATOCRIT: 36.7 % — AB (ref 38.4–49.9)
HEMOGLOBIN: 12.4 g/dL — AB (ref 13.0–17.1)
Lymphocytes Relative: 33 %
Lymphs Abs: 1.7 10*3/uL (ref 0.9–3.3)
MCH: 31.6 pg (ref 27.2–33.4)
MCHC: 33.9 g/dL (ref 32.0–36.0)
MCV: 93.1 fL (ref 79.3–98.0)
Monocytes Absolute: 0.4 10*3/uL (ref 0.1–0.9)
Monocytes Relative: 8 %
NEUTROS ABS: 3.1 10*3/uL (ref 1.5–6.5)
NEUTROS PCT: 57 %
Platelet Count: 138 10*3/uL — ABNORMAL LOW (ref 140–400)
RBC: 3.94 MIL/uL — AB (ref 4.20–5.82)
RDW: 14.2 % (ref 11.0–14.6)
WBC Count: 5.3 10*3/uL (ref 4.0–10.3)

## 2018-01-25 MED ORDER — SODIUM CHLORIDE 0.9 % IV SOLN
Freq: Once | INTRAVENOUS | Status: AC
  Administered 2018-01-25: 14:00:00 via INTRAVENOUS

## 2018-01-25 MED ORDER — SODIUM CHLORIDE 0.9% FLUSH
10.0000 mL | INTRAVENOUS | Status: DC | PRN
  Administered 2018-01-25: 10 mL
  Filled 2018-01-25: qty 10

## 2018-01-25 MED ORDER — HEPARIN SOD (PORK) LOCK FLUSH 100 UNIT/ML IV SOLN
500.0000 [IU] | Freq: Once | INTRAVENOUS | Status: AC | PRN
  Administered 2018-01-25: 500 [IU]
  Filled 2018-01-25: qty 5

## 2018-01-25 MED ORDER — SODIUM CHLORIDE 0.9 % IV SOLN
1000.0000 mg/m2 | Freq: Once | INTRAVENOUS | Status: AC
  Administered 2018-01-25: 2356 mg via INTRAVENOUS
  Filled 2018-01-25: qty 61.96

## 2018-01-25 MED ORDER — PROCHLORPERAZINE MALEATE 10 MG PO TABS
10.0000 mg | ORAL_TABLET | Freq: Once | ORAL | Status: AC
  Administered 2018-01-25: 10 mg via ORAL

## 2018-01-25 MED ORDER — PROCHLORPERAZINE MALEATE 10 MG PO TABS
ORAL_TABLET | ORAL | Status: AC
  Filled 2018-01-25: qty 1

## 2018-01-25 MED ORDER — SODIUM CHLORIDE 0.9 % IV SOLN: Freq: Once | INTRAVENOUS | Status: AC

## 2018-01-25 NOTE — Progress Notes (Signed)
Okay to treat creatinine of 1.87 per Dr. Burr Medico.

## 2018-01-25 NOTE — Patient Instructions (Signed)
[facility] °Discharge Instructions for Patients Receiving Chemotherapy ° °Today you received the following chemotherapy agents Gemzar ° °To help prevent nausea and vomiting after your treatment, we encourage you to take your nausea medication as directed. °  °If you develop nausea and vomiting that is not controlled by your nausea medication, call the clinic.  ° °BELOW ARE SYMPTOMS THAT SHOULD BE REPORTED IMMEDIATELY: °· *FEVER GREATER THAN 100.5 F °· *CHILLS WITH OR WITHOUT FEVER °· NAUSEA AND VOMITING THAT IS NOT CONTROLLED WITH YOUR NAUSEA MEDICATION °· *UNUSUAL SHORTNESS OF BREATH °· *UNUSUAL BRUISING OR BLEEDING °· TENDERNESS IN MOUTH AND THROAT WITH OR WITHOUT PRESENCE OF ULCERS °· *URINARY PROBLEMS °· *BOWEL PROBLEMS °· UNUSUAL RASH °Items with * indicate a potential emergency and should be followed up as soon as possible. ° °Feel free to call the clinic should you have any questions or concerns. The clinic phone number is [phone]. ° °Please show the CHEMO ALERT CARD at check-in to the Emergency Department and triage nurse. ° ° °

## 2018-02-07 ENCOUNTER — Telehealth: Payer: Self-pay | Admitting: Oncology

## 2018-02-07 ENCOUNTER — Inpatient Hospital Stay: Payer: Medicare Other

## 2018-02-07 ENCOUNTER — Inpatient Hospital Stay (HOSPITAL_BASED_OUTPATIENT_CLINIC_OR_DEPARTMENT_OTHER): Payer: Medicare Other | Admitting: Oncology

## 2018-02-07 VITALS — BP 126/62 | HR 68 | Temp 98.3°F | Resp 17 | Ht 69.5 in | Wt 256.2 lb

## 2018-02-07 DIAGNOSIS — Z95828 Presence of other vascular implants and grafts: Secondary | ICD-10-CM

## 2018-02-07 DIAGNOSIS — C679 Malignant neoplasm of bladder, unspecified: Secondary | ICD-10-CM | POA: Diagnosis not present

## 2018-02-07 DIAGNOSIS — R5383 Other fatigue: Secondary | ICD-10-CM | POA: Diagnosis not present

## 2018-02-07 DIAGNOSIS — Z5111 Encounter for antineoplastic chemotherapy: Secondary | ICD-10-CM | POA: Diagnosis not present

## 2018-02-07 LAB — CBC WITH DIFFERENTIAL (CANCER CENTER ONLY)
BASOS ABS: 0 10*3/uL (ref 0.0–0.1)
BASOS PCT: 1 %
EOS ABS: 0.1 10*3/uL (ref 0.0–0.5)
EOS PCT: 2 %
HEMATOCRIT: 35.1 % — AB (ref 38.4–49.9)
Hemoglobin: 11.7 g/dL — ABNORMAL LOW (ref 13.0–17.1)
LYMPHS ABS: 1.5 10*3/uL (ref 0.9–3.3)
Lymphocytes Relative: 35 %
MCH: 31.7 pg (ref 27.2–33.4)
MCHC: 33.3 g/dL (ref 32.0–36.0)
MCV: 95.1 fL (ref 79.3–98.0)
Monocytes Absolute: 0.7 10*3/uL (ref 0.1–0.9)
Monocytes Relative: 16 %
NEUTROS PCT: 46 %
Neutro Abs: 2.1 10*3/uL (ref 1.5–6.5)
Platelet Count: 127 10*3/uL — ABNORMAL LOW (ref 140–400)
RBC: 3.69 MIL/uL — AB (ref 4.20–5.82)
RDW: 16.9 % — ABNORMAL HIGH (ref 11.0–14.6)
WBC: 4.3 10*3/uL (ref 4.0–10.3)

## 2018-02-07 LAB — CMP (CANCER CENTER ONLY)
ALK PHOS: 73 U/L (ref 38–126)
ALT: 10 U/L (ref 0–44)
AST: 14 U/L — AB (ref 15–41)
Albumin: 3.9 g/dL (ref 3.5–5.0)
Anion gap: 7 (ref 5–15)
BILIRUBIN TOTAL: 0.3 mg/dL (ref 0.3–1.2)
BUN: 24 mg/dL — AB (ref 8–23)
CALCIUM: 9.1 mg/dL (ref 8.9–10.3)
CHLORIDE: 106 mmol/L (ref 98–111)
CO2: 25 mmol/L (ref 22–32)
Creatinine: 1.37 mg/dL — ABNORMAL HIGH (ref 0.61–1.24)
GFR, EST NON AFRICAN AMERICAN: 50 mL/min — AB (ref >60)
GFR, Est AFR Am: 57 mL/min — ABNORMAL LOW (ref >60)
Glucose, Bld: 134 mg/dL — ABNORMAL HIGH (ref 70–99)
Potassium: 4 mmol/L (ref 3.5–5.1)
Sodium: 138 mmol/L (ref 135–145)
Total Protein: 6.6 g/dL (ref 6.5–8.1)

## 2018-02-07 MED ORDER — SODIUM CHLORIDE 0.9 % IV SOLN
50.0000 mg/m2 | Freq: Once | INTRAVENOUS | Status: AC
  Administered 2018-02-07: 119 mg via INTRAVENOUS
  Filled 2018-02-07: qty 119

## 2018-02-07 MED ORDER — POTASSIUM CHLORIDE 2 MEQ/ML IV SOLN
Freq: Once | INTRAVENOUS | Status: AC
  Administered 2018-02-07: 10:00:00 via INTRAVENOUS
  Filled 2018-02-07: qty 10

## 2018-02-07 MED ORDER — PALONOSETRON HCL INJECTION 0.25 MG/5ML
0.2500 mg | Freq: Once | INTRAVENOUS | Status: AC
  Administered 2018-02-07: 0.25 mg via INTRAVENOUS

## 2018-02-07 MED ORDER — SODIUM CHLORIDE 0.9 % IV SOLN
1000.0000 mg/m2 | Freq: Once | INTRAVENOUS | Status: AC
  Administered 2018-02-07: 2356 mg via INTRAVENOUS
  Filled 2018-02-07: qty 61.96

## 2018-02-07 MED ORDER — PALONOSETRON HCL INJECTION 0.25 MG/5ML
INTRAVENOUS | Status: AC
  Filled 2018-02-07: qty 5

## 2018-02-07 MED ORDER — SODIUM CHLORIDE 0.9 % IV SOLN
Freq: Once | INTRAVENOUS | Status: AC
  Administered 2018-02-07: 13:00:00 via INTRAVENOUS
  Filled 2018-02-07: qty 5

## 2018-02-07 MED ORDER — SODIUM CHLORIDE 0.9% FLUSH
10.0000 mL | INTRAVENOUS | Status: DC | PRN
  Administered 2018-02-07: 10 mL
  Filled 2018-02-07: qty 10

## 2018-02-07 MED ORDER — HEPARIN SOD (PORK) LOCK FLUSH 100 UNIT/ML IV SOLN
500.0000 [IU] | Freq: Once | INTRAVENOUS | Status: AC | PRN
  Administered 2018-02-07: 500 [IU]
  Filled 2018-02-07: qty 5

## 2018-02-07 MED ORDER — SODIUM CHLORIDE 0.9 % IV SOLN
Freq: Once | INTRAVENOUS | Status: AC
  Administered 2018-02-07: 09:00:00 via INTRAVENOUS

## 2018-02-07 MED ORDER — PROCHLORPERAZINE MALEATE 10 MG PO TABS: 10.0000 mg | ORAL_TABLET | Freq: Four times a day (QID) | ORAL | 3 refills | Status: DC | PRN

## 2018-02-07 NOTE — Patient Instructions (Signed)
[facility] Discharge Instructions for Patients Receiving Chemotherapy  Today you received the following chemotherapy agents: Cisplatin and Gemzar  To help prevent nausea and vomiting after your treatment, we encourage you to take your nausea medication  as prescribed.    If you develop nausea and vomiting that is not controlled by your nausea medication, call the clinic.   BELOW ARE SYMPTOMS THAT SHOULD BE REPORTED IMMEDIATELY:  *FEVER GREATER THAN 100.5 F  *CHILLS WITH OR WITHOUT FEVER  NAUSEA AND VOMITING THAT IS NOT CONTROLLED WITH YOUR NAUSEA MEDICATION  *UNUSUAL SHORTNESS OF BREATH  *UNUSUAL BRUISING OR BLEEDING  TENDERNESS IN MOUTH AND THROAT WITH OR WITHOUT PRESENCE OF ULCERS  *URINARY PROBLEMS  *BOWEL PROBLEMS  UNUSUAL RASH Items with * indicate a potential emergency and should be followed up as soon as possible.  Feel free to call the clinic should you have any questions or concerns. The clinic phone number is [phone].  Please show the CHEMO ALERT CARD at check-in to the Emergency Department and triage nurse.   

## 2018-02-07 NOTE — Telephone Encounter (Signed)
Scheduled appt per 7/18 los - gave patient AVS and calender per los.  

## 2018-02-07 NOTE — Progress Notes (Signed)
Hematology and Oncology Follow Up Visit  Nathaniel Kirk [id number] 29-Jan-1946 72 y.o. 02/07/2018 8:20 AM Willey Blade, MDShelton, [name], MD   Principle Diagnosis: 72 year old man with bladder cancer diagnosed in February 2019.  He presented with stage IVa high-grade urothelial carcinoma with retroperitoneal lymph node involvement.  Prior Therapy: He is status post TURBT January 2019 and repeated in October 22, 2017.  Pathology showed high-grade urothelial carcinoma with muscle invasion indicating at least T2 disease.  Current therapy: Chemotherapy utilizing gemcitabine and cisplatin chemotherapy started in Dec 05, 2017.  He is receiving gemcitabine and cisplatin on day 1 with gemcitabine on day 8 out of a 21-day cycle.  Is here to start cycle 4 of therapy.  Interim History: Mr. Cull presents today for a return visit.  Since the last visit, he completed cycle 3 without any major complications.  He denies any nausea, vomiting or peripheral neuropathy.  He denies any hearing loss or change in his appetite.  Continues to do well and maintain his weight.  His performance status and activity level remain unchanged.  He does report grade 1 fatigue that usually last for 24 hours but subsequently subsides.  He denies any infusion related complications.  He is urinating frequently without any dysuria or hematuria.  He does not report any headaches, blurry vision, syncope or seizures.  He denies any dizziness or confusion.  Does not report any fevers, chills or sweats.  Does not report any cough, wheezing or hemoptysis.  Does not report any chest pain, palpitation, orthopnea or leg edema.  Denies any dyspnea on exertion. Does not report any nausea, vomiting or abdominal pain.  Denies any abdominal distention or weight loss.  Does not report any bone pain or pathological fractures.   Does not report frequency, urgency or hematuria.  Does not report any skin rashes or lesions..  Does not report any  lymphadenopathy or petechiae.  He does not report any heat or cold intolerance.  He denies any anxiety or depression.  Remaining review of systems is negative.    Medications: I have reviewed the patient's current medications.  Current Outpatient Medications  Medication Sig Dispense Refill  . buPROPion (WELLBUTRIN) 100 MG tablet     . cetirizine-pseudoephedrine (ZYRTEC-D) 5-120 MG tablet Take 1 tablet by mouth 2 (two) times daily as needed for allergies.    Marland Kitchen lidocaine-prilocaine (EMLA) cream Apply 1 application topically as needed. 30 g 0  . naproxen sodium (ALEVE) 220 MG tablet Take 220 mg by mouth 2 (two) times daily as needed (for knee pain/arthritis pain.).     Marland Kitchen prochlorperazine (COMPAZINE) 10 MG tablet TAKE 1 TABLET (10 MG TOTAL) BY MOUTH EVERY 6 (SIX) HOURS AS NEEDED FOR NAUSEA OR VOMITING. 30 tablet 0   No current facility-administered medications for this visit.    Facility-Administered Medications Ordered in Other Visits  Medication Dose Route Frequency Provider Last Rate Last Dose  . 0.9 %  sodium chloride infusion   Intravenous Once Truitt Merle, MD      . sodium chloride flush (NS) 0.9 % injection 10 mL  10 mL Intracatheter PRN Wyatt Portela, MD   10 mL at 02/07/18 0802     Allergies: No Known Allergies  Past Medical History, Surgical history, Social history, and Family History were reviewed and updated.    Physical Exam:  Blood pressure 126/62, pulse 68, temperature 98.3 F (36.8 C), temperature source Oral, resp. rate 17, height 5' 9.5" (1.765 m), weight 256 lb 3.2 oz (116.2 kg),  SpO2 96 %.    ECOG: 0 General appearance: Alert, awake gentleman without distress. Head: Normocephalic without trauma. Oropharynx: Mucous membranes are moist and pink. Eyes: Sclera anicteric.  Extraocular muscle intact. Lymph nodes: No cervical, supraclavicular, inguinal or axillary nodes  Heart:regular rate without any murmurs or gallops.  S1 and S2 and no leg edema. Lung:clear in all  lung fields without any wheezes or dullness to percussion. Abdomin: soft without any tenderness or rebound.  Good bowel sounds.  No shifting dullness or ascites. Neurological: No motor or sensory deficits.  Intact deep tendon reflexes. Skin: No skin rashes or lesions. Musculoskeletal: No joint deformity or effusion.     Lab Results: Lab Results  Component Value Date   WBC 5.3 01/25/2018   HGB 12.4 (L) 01/25/2018   HCT 36.7 (L) 01/25/2018   MCV 93.1 01/25/2018   PLT 138 (L) 01/25/2018     Chemistry      Component Value Date/Time   NA 136 01/25/2018 1149   K 5.2 (H) 01/25/2018 1149   CL 104 01/25/2018 1149   CO2 25 01/25/2018 1149   BUN 28 (H) 01/25/2018 1149   CREATININE 1.87 (H) 01/25/2018 1149   CREATININE 1.36 (H) 01/17/2018 0803      Component Value Date/Time   CALCIUM 9.6 01/25/2018 1149   ALKPHOS 80 01/25/2018 1149   AST 18 01/25/2018 1149   AST 15 01/17/2018 0803   ALT 14 01/25/2018 1149   ALT 12 01/17/2018 0803   BILITOT 0.2 (L) 01/25/2018 1149   BILITOT 0.3 01/17/2018 0803       Impression and Plan:   72 year old man with the:  1.    High-grade urothelial carcinoma of the bladder diagnosed February 2019.  Is found to have retroperitoneal lymph node involvement indicating stage IVa disease.    He is receiving neoadjuvant chemotherapy utilizing gemcitabine and cisplatin and completed 3 cycles of therapy.  He reports no complications related to this therapy and no delayed issues or concerns.  His kidney function continues to fluctuate that requires adjustments of his cisplatin dosing.  The risks and benefits of continuing this approach was reviewed today is agreeable to continue.  The plan is to complete 6 cycles of therapy if he can tolerate that prior to repeat imaging studies.  If his CT scan showed no evidence of disease outside of the bladder, definitive therapy with radical cystectomy will be next.  I anticipate completing chemotherapy for the first week of  September.  2.  IV access: Port-A-Cath continues to be in use without issues.  3.  Antiemetics: He uses Compazine prophylactically to prevent any nausea.  This will be refilled to him today.  4.  Renal function surveillance: His creatinine clearance has been around 40 cc/min.  This will be repeated today and cisplatin dosing will be adjusted accordingly.  6.  Neutropenia: His white cell count and absolute neutrophil count is within normal range.  I will proceed with chemotherapy today without any further dose reduction or delay.  7.  Follow-up: We will be in 1 week for day 8 of cycle 4 and an 3 weeks for the beginning of the next cycle of therapy.  25 minutes was spent with the patient face-to-face today.  More than 50% of time was dedicated to patient counseling, education and discussing the natural course of this disease as well as future treatment options.    Zola Button, MD 7/18/20198:20 AM

## 2018-02-07 NOTE — Addendum Note (Signed)
Addended by: Wyatt Portela on: 02/07/2018 02:59 PM   Modules accepted: Orders

## 2018-02-14 ENCOUNTER — Inpatient Hospital Stay: Payer: Medicare Other

## 2018-02-14 VITALS — BP 115/78 | HR 68 | Temp 98.1°F | Resp 17

## 2018-02-14 DIAGNOSIS — Z95828 Presence of other vascular implants and grafts: Secondary | ICD-10-CM

## 2018-02-14 DIAGNOSIS — C679 Malignant neoplasm of bladder, unspecified: Secondary | ICD-10-CM

## 2018-02-14 DIAGNOSIS — Z5111 Encounter for antineoplastic chemotherapy: Secondary | ICD-10-CM | POA: Diagnosis not present

## 2018-02-14 LAB — CMP (CANCER CENTER ONLY)
ALT: 9 U/L (ref 0–44)
AST: 15 U/L (ref 15–41)
Albumin: 3.9 g/dL (ref 3.5–5.0)
Alkaline Phosphatase: 73 U/L (ref 38–126)
Anion gap: 8 (ref 5–15)
BUN: 28 mg/dL — AB (ref 8–23)
CHLORIDE: 104 mmol/L (ref 98–111)
CO2: 26 mmol/L (ref 22–32)
CREATININE: 1.56 mg/dL — AB (ref 0.61–1.24)
Calcium: 9.2 mg/dL (ref 8.9–10.3)
GFR, EST NON AFRICAN AMERICAN: 42 mL/min — AB (ref >60)
GFR, Est AFR Am: 49 mL/min — ABNORMAL LOW (ref >60)
Glucose, Bld: 126 mg/dL — ABNORMAL HIGH (ref 70–99)
Potassium: 4.1 mmol/L (ref 3.5–5.1)
Sodium: 138 mmol/L (ref 135–145)
Total Bilirubin: 0.5 mg/dL (ref 0.3–1.2)
Total Protein: 6.7 g/dL (ref 6.5–8.1)

## 2018-02-14 LAB — CBC WITH DIFFERENTIAL (CANCER CENTER ONLY)
BASOS ABS: 0 10*3/uL (ref 0.0–0.1)
Basophils Relative: 1 %
EOS PCT: 0 %
Eosinophils Absolute: 0 10*3/uL (ref 0.0–0.5)
HCT: 33.8 % — ABNORMAL LOW (ref 38.4–49.9)
HEMOGLOBIN: 11.4 g/dL — AB (ref 13.0–17.1)
LYMPHS PCT: 38 %
Lymphs Abs: 1.9 10*3/uL (ref 0.9–3.3)
MCH: 31.9 pg (ref 27.2–33.4)
MCHC: 33.7 g/dL (ref 32.0–36.0)
MCV: 94.7 fL (ref 79.3–98.0)
Monocytes Absolute: 0.2 10*3/uL (ref 0.1–0.9)
Monocytes Relative: 5 %
NEUTROS ABS: 2.8 10*3/uL (ref 1.5–6.5)
NEUTROS PCT: 56 %
PLATELETS: 96 10*3/uL — AB (ref 140–400)
RBC: 3.57 MIL/uL — AB (ref 4.20–5.82)
RDW: 16.3 % — ABNORMAL HIGH (ref 11.0–14.6)
WBC: 5 10*3/uL (ref 4.0–10.3)

## 2018-02-14 MED ORDER — HEPARIN SOD (PORK) LOCK FLUSH 100 UNIT/ML IV SOLN
500.0000 [IU] | Freq: Once | INTRAVENOUS | Status: AC | PRN
  Administered 2018-02-14: 500 [IU]
  Filled 2018-02-14: qty 5

## 2018-02-14 MED ORDER — SODIUM CHLORIDE 0.9 % IV SOLN
1000.0000 mg/m2 | Freq: Once | INTRAVENOUS | Status: AC
  Administered 2018-02-14: 2356 mg via INTRAVENOUS
  Filled 2018-02-14: qty 62

## 2018-02-14 MED ORDER — PROCHLORPERAZINE MALEATE 10 MG PO TABS: 10.0000 mg | ORAL_TABLET | Freq: Once | ORAL | Status: DC

## 2018-02-14 MED ORDER — SODIUM CHLORIDE 0.9 % IV SOLN
Freq: Once | INTRAVENOUS | Status: AC
  Administered 2018-02-14: 09:00:00 via INTRAVENOUS
  Filled 2018-02-14: qty 250

## 2018-02-14 MED ORDER — SODIUM CHLORIDE 0.9% FLUSH
10.0000 mL | INTRAVENOUS | Status: DC | PRN
  Administered 2018-02-14: 10 mL
  Filled 2018-02-14: qty 10

## 2018-02-14 NOTE — Patient Instructions (Signed)
[facility] °Discharge Instructions for Patients Receiving Chemotherapy ° °Today you received the following chemotherapy agents Gemzar ° °To help prevent nausea and vomiting after your treatment, we encourage you to take your nausea medication as directed. °  °If you develop nausea and vomiting that is not controlled by your nausea medication, call the clinic.  ° °BELOW ARE SYMPTOMS THAT SHOULD BE REPORTED IMMEDIATELY: °· *FEVER GREATER THAN 100.5 F °· *CHILLS WITH OR WITHOUT FEVER °· NAUSEA AND VOMITING THAT IS NOT CONTROLLED WITH YOUR NAUSEA MEDICATION °· *UNUSUAL SHORTNESS OF BREATH °· *UNUSUAL BRUISING OR BLEEDING °· TENDERNESS IN MOUTH AND THROAT WITH OR WITHOUT PRESENCE OF ULCERS °· *URINARY PROBLEMS °· *BOWEL PROBLEMS °· UNUSUAL RASH °Items with * indicate a potential emergency and should be followed up as soon as possible. ° °Feel free to call the clinic should you have any questions or concerns. The clinic phone number is [phone]. ° °Please show the CHEMO ALERT CARD at check-in to the Emergency Department and triage nurse. ° ° °

## 2018-02-14 NOTE — Progress Notes (Signed)
Okay to treat with today's labs per Dr. Shadad 

## 2018-02-28 ENCOUNTER — Inpatient Hospital Stay: Payer: Medicare Other

## 2018-02-28 ENCOUNTER — Inpatient Hospital Stay: Payer: Medicare Other | Attending: Oncology | Admitting: Oncology

## 2018-02-28 VITALS — BP 148/82 | HR 73 | Temp 98.2°F | Resp 17 | Ht 69.5 in | Wt 253.4 lb

## 2018-02-28 DIAGNOSIS — R5383 Other fatigue: Secondary | ICD-10-CM

## 2018-02-28 DIAGNOSIS — Z5111 Encounter for antineoplastic chemotherapy: Secondary | ICD-10-CM | POA: Insufficient documentation

## 2018-02-28 DIAGNOSIS — C679 Malignant neoplasm of bladder, unspecified: Secondary | ICD-10-CM | POA: Diagnosis present

## 2018-02-28 DIAGNOSIS — Z79899 Other long term (current) drug therapy: Secondary | ICD-10-CM | POA: Diagnosis not present

## 2018-02-28 DIAGNOSIS — D709 Neutropenia, unspecified: Secondary | ICD-10-CM | POA: Diagnosis not present

## 2018-02-28 DIAGNOSIS — Z95828 Presence of other vascular implants and grafts: Secondary | ICD-10-CM

## 2018-02-28 LAB — CMP (CANCER CENTER ONLY)
ALBUMIN: 3.9 g/dL (ref 3.5–5.0)
ALT: 7 U/L (ref 0–44)
AST: 14 U/L — AB (ref 15–41)
Alkaline Phosphatase: 74 U/L (ref 38–126)
Anion gap: 10 (ref 5–15)
BUN: 21 mg/dL (ref 8–23)
CHLORIDE: 105 mmol/L (ref 98–111)
CO2: 23 mmol/L (ref 22–32)
Calcium: 9.3 mg/dL (ref 8.9–10.3)
Creatinine: 1.43 mg/dL — ABNORMAL HIGH (ref 0.61–1.24)
GFR, Est AFR Am: 55 mL/min — ABNORMAL LOW (ref >60)
GFR, Estimated: 47 mL/min — ABNORMAL LOW (ref >60)
GLUCOSE: 99 mg/dL (ref 70–99)
POTASSIUM: 4.4 mmol/L (ref 3.5–5.1)
Sodium: 138 mmol/L (ref 135–145)
Total Bilirubin: 0.4 mg/dL (ref 0.3–1.2)
Total Protein: 6.7 g/dL (ref 6.5–8.1)

## 2018-02-28 LAB — CBC WITH DIFFERENTIAL (CANCER CENTER ONLY)
BASOS ABS: 0 10*3/uL (ref 0.0–0.1)
Basophils Relative: 1 %
EOS ABS: 0.1 10*3/uL (ref 0.0–0.5)
EOS PCT: 2 %
HCT: 32.8 % — ABNORMAL LOW (ref 38.4–49.9)
Hemoglobin: 11 g/dL — ABNORMAL LOW (ref 13.0–17.1)
Lymphocytes Relative: 35 %
Lymphs Abs: 1.4 10*3/uL (ref 0.9–3.3)
MCH: 32.8 pg (ref 27.2–33.4)
MCHC: 33.5 g/dL (ref 32.0–36.0)
MCV: 97.9 fL (ref 79.3–98.0)
MONO ABS: 0.5 10*3/uL (ref 0.1–0.9)
Monocytes Relative: 14 %
Neutro Abs: 1.9 10*3/uL (ref 1.5–6.5)
Neutrophils Relative %: 48 %
PLATELETS: 181 10*3/uL (ref 140–400)
RBC: 3.35 MIL/uL — ABNORMAL LOW (ref 4.20–5.82)
RDW: 18.6 % — AB (ref 11.0–14.6)
WBC Count: 3.9 10*3/uL — ABNORMAL LOW (ref 4.0–10.3)

## 2018-02-28 MED ORDER — SODIUM CHLORIDE 0.9% FLUSH
10.0000 mL | INTRAVENOUS | Status: DC | PRN
  Administered 2018-02-28: 10 mL
  Filled 2018-02-28: qty 10

## 2018-02-28 MED ORDER — HEPARIN SOD (PORK) LOCK FLUSH 100 UNIT/ML IV SOLN
500.0000 [IU] | Freq: Once | INTRAVENOUS | Status: AC | PRN
  Administered 2018-02-28: 500 [IU]
  Filled 2018-02-28: qty 5

## 2018-02-28 MED ORDER — GEMCITABINE HCL CHEMO INJECTION 1 GM/26.3ML
1000.0000 mg/m2 | Freq: Once | INTRAVENOUS | Status: AC
  Administered 2018-02-28: 2356 mg via INTRAVENOUS
  Filled 2018-02-28: qty 61.96

## 2018-02-28 MED ORDER — POTASSIUM CHLORIDE 2 MEQ/ML IV SOLN
Freq: Once | INTRAVENOUS | Status: AC
  Administered 2018-02-28: 11:00:00 via INTRAVENOUS
  Filled 2018-02-28: qty 10

## 2018-02-28 MED ORDER — SODIUM CHLORIDE 0.9 % IV SOLN
Freq: Once | INTRAVENOUS | Status: AC
  Administered 2018-02-28: 14:00:00 via INTRAVENOUS
  Filled 2018-02-28: qty 5

## 2018-02-28 MED ORDER — SODIUM CHLORIDE 0.9 % IV SOLN
50.0000 mg/m2 | Freq: Once | INTRAVENOUS | Status: AC
  Administered 2018-02-28: 119 mg via INTRAVENOUS
  Filled 2018-02-28: qty 119

## 2018-02-28 MED ORDER — PALONOSETRON HCL INJECTION 0.25 MG/5ML
0.2500 mg | Freq: Once | INTRAVENOUS | Status: AC
  Administered 2018-02-28: 0.25 mg via INTRAVENOUS

## 2018-02-28 MED ORDER — SODIUM CHLORIDE 0.9 % IV SOLN
Freq: Once | INTRAVENOUS | Status: AC
  Administered 2018-02-28: 10:00:00 via INTRAVENOUS
  Filled 2018-02-28: qty 250

## 2018-02-28 MED ORDER — PALONOSETRON HCL INJECTION 0.25 MG/5ML
INTRAVENOUS | Status: AC
  Filled 2018-02-28: qty 5

## 2018-02-28 NOTE — Patient Instructions (Addendum)
[facility] Discharge Instructions for Patients Receiving Chemotherapy  Today you received the following chemotherapy agents cisplatin,gemzar  To help prevent nausea and vomiting after your treatment, we encourage you to take your nausea medication as directed   If you develop nausea and vomiting that is not controlled by your nausea medication, call the clinic.   BELOW ARE SYMPTOMS THAT SHOULD BE REPORTED IMMEDIATELY:  *FEVER GREATER THAN 100.5 F  *CHILLS WITH OR WITHOUT FEVER  NAUSEA AND VOMITING THAT IS NOT CONTROLLED WITH YOUR NAUSEA MEDICATION  *UNUSUAL SHORTNESS OF BREATH  *UNUSUAL BRUISING OR BLEEDING  TENDERNESS IN MOUTH AND THROAT WITH OR WITHOUT PRESENCE OF ULCERS  *URINARY PROBLEMS  *BOWEL PROBLEMS  UNUSUAL RASH Items with * indicate a potential emergency and should be followed up as soon as possible.  Feel free to call the clinic should you have any questions or concerns. The clinic phone number is [phone].  Please show the [facility] at check-in to the Emergency Department and triage nurse.

## 2018-02-28 NOTE — Progress Notes (Signed)
Okay to run the electrolyte fluids with the Cisplatin per Dr. Alen Blew.

## 2018-02-28 NOTE — Progress Notes (Signed)
Dr. Alen Blew aware patient has not voided enough urine after the prehydration time and MD said to proceed with treatment now.

## 2018-02-28 NOTE — Progress Notes (Signed)
Hematology and Oncology Follow Up Visit  Nathaniel Kirk [id number] 1945/09/15 72 y.o. 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  He has lymph node disease involvement only.  Prior Therapy: He is status post TURBT January 2019 and repeated in October 22, 2017.  Pathology showed high-grade urothelial carcinoma with muscle invasion indicating at least T2 disease.  Current therapy: Neoadjuvant chemotherapy utilizing gemcitabine and cisplatin chemotherapy started in Dec 05, 2017.  He is receiving gemcitabine and cisplatin on day 1 with gemcitabine on day 8 out of a 21-day cycle.  This plan is dose reduced because of renal function.  He is here for cycle 5.  Interim History: [name] is here for a follow-up.  Since the last visit, he completed cycle 4 of chemotherapy without complications.  He denies any nausea, vomiting or peripheral neuropathy.  He remains active and he is eating very well.  His weight is up without any decline in his performance status.  He does report some mild fatigue that is manageable.  He denies any urinary difficulties and he is moving his bowels regularly.  He denies any falls or dizziness.  He does not report any headaches, blurry vision, syncope or seizures.  He denies any alteration in mental status or confusion.  Does not report any fevers, chills or sweats.  Does not report any cough, wheezing or hemoptysis.  Does not report any chest pain, palpitation, orthopnea or leg edema.  He does not report any abdominal pain, early satiety or distention.   Does not report any bulges or myalgias does not report frequency, urgency or hematuria.  Does not report any skin rashes or lesions.  Denies any bleeding or clotting tendencies.  He denies any change in his mood.  Remaining review of systems is negative.    Medications: I have  reviewed the patient's current medications.  Current Outpatient Medications  Medication Sig Dispense Refill  . buPROPion (WELLBUTRIN) 100 MG tablet     . cetirizine-pseudoephedrine (ZYRTEC-D) 5-120 MG tablet Take 1 tablet by mouth 2 (two) times daily as needed for allergies.    Marland Kitchen lidocaine-prilocaine (EMLA) cream Apply 1 application topically as needed. 30 g 0  . naproxen sodium (ALEVE) 220 MG tablet Take 220 mg by mouth 2 (two) times daily as needed (for knee pain/arthritis pain.).     Marland Kitchen prochlorperazine (COMPAZINE) 10 MG tablet Take 1 tablet (10 mg total) by mouth every 6 (six) hours as needed for nausea or vomiting. 30 tablet 3   No current facility-administered medications for this visit.    Facility-Administered Medications Ordered in Other Visits  Medication Dose Route Frequency Provider Last Rate Last Dose  . 0.9 %  sodium chloride infusion   Intravenous Once Truitt Merle, MD         Allergies: No Known Allergies  Past Medical History, Surgical history, Social history, and Family History were reviewed and updated.    Physical Exam:  Blood pressure (!) 148/82, pulse 73, temperature 98.2 F (36.8 C), temperature source Oral, resp. rate 17, height 5' 9.5" (1.765 m), weight 253 lb 6.4 oz (114.9 kg), SpO2 95 %.    ECOG: 0 General appearance: Well-appearing gentleman without distress.   Head: Atraumatic without abnormalities. Oropharynx: X membranes are moist and pink. Eyes: Sclera anicteric. Lymph nodes: No lymphadenopathy noted in the cervical, supraclavicular, inguinal or axillary regions. Heart:regular rate rhythm.  S1  and S2 without leg edema. Lung:clear without any rhonchi, wheezes or dullness to percussion. Abdomin: soft, nontender without any rebound or guarding.  No shifting dullness or ascites. Neurological: No motor or sensory deficits. Skin: No ecchymosis or petechiae. Musculoskeletal: No clubbing or cyanosis.     Lab Results: Lab Results  Component Value  Date   WBC 3.9 (L) 02/28/2018   HGB 11.0 (L) 02/28/2018   HCT 32.8 (L) 02/28/2018   MCV 97.9 02/28/2018   PLT 181 02/28/2018     Chemistry      Component Value Date/Time   NA 138 02/14/2018 0735   K 4.1 02/14/2018 0735   CL 104 02/14/2018 0735   CO2 26 02/14/2018 0735   BUN 28 (H) 02/14/2018 0735   CREATININE 1.56 (H) 02/14/2018 0735      Component Value Date/Time   CALCIUM 9.2 02/14/2018 0735   ALKPHOS 73 02/14/2018 0735   AST 15 02/14/2018 0735   ALT 9 02/14/2018 0735   BILITOT 0.5 02/14/2018 0735       Impression and Plan:   72 year old man with the:  1.    Stage IVa high-grade urothelial carcinoma of the bladder diagnosed February 2019.  He has disease involvement with lymphadenopathy.    He continues to tolerate neoadjuvant chemotherapy with gemcitabine and cisplatin without complications.  He is about to start cycle 5 of therapy without any need for any dose reduction or delay at this time.  Risks and benefits of continuing this regimen was reviewed today and his future plan were reviewed.  The plan is to complete 6 cycles of therapy before obtaining imaging studies.  He is scheduled to have surgery tentatively in October 2019 if his CT scan showed no evidence of residual disease.  2.  IV access: Port-A-Cath remains in use without any complications.  3.  Antiemetics: Bruising has been effective in controlling his nausea.  4.  Renal function surveillance: Creatinine clearance continues to be around 50 cc/min.  We will continue to monitor closely on cisplatin.  6.  Neutropenia: His absolute neutrophil count is normal we will continue to monitor it weekly.  Eliminating day 8 of gemcitabine would be a possibility if he develops worsening cytopenias.  7.  Follow-up: We will be in 1 week for day 8 of cycle 5 of therapy.  He will return in 3 weeks to start cycle 6.  25 minutes was spent with the patient face-to-face today.  More than 50% of time was dedicated to  cussing the natural course of his disease, logistics of his future treatments as well as future plan of care.    Zola Button, MD 8/8/20198:49 AM

## 2018-03-01 ENCOUNTER — Telehealth: Payer: Self-pay

## 2018-03-01 NOTE — Telephone Encounter (Signed)
Per 8/8 no los 

## 2018-03-07 ENCOUNTER — Inpatient Hospital Stay: Payer: Medicare Other

## 2018-03-07 VITALS — BP 141/70 | HR 64 | Temp 98.0°F | Resp 17

## 2018-03-07 DIAGNOSIS — C679 Malignant neoplasm of bladder, unspecified: Secondary | ICD-10-CM

## 2018-03-07 DIAGNOSIS — Z5111 Encounter for antineoplastic chemotherapy: Secondary | ICD-10-CM | POA: Diagnosis not present

## 2018-03-07 DIAGNOSIS — Z95828 Presence of other vascular implants and grafts: Secondary | ICD-10-CM

## 2018-03-07 LAB — CBC WITH DIFFERENTIAL (CANCER CENTER ONLY)
BASOS PCT: 1 %
Basophils Absolute: 0 10*3/uL (ref 0.0–0.1)
Eosinophils Absolute: 0 10*3/uL (ref 0.0–0.5)
Eosinophils Relative: 1 %
HEMATOCRIT: 32 % — AB (ref 38.4–49.9)
HEMOGLOBIN: 10.6 g/dL — AB (ref 13.0–17.1)
LYMPHS ABS: 1.6 10*3/uL (ref 0.9–3.3)
Lymphocytes Relative: 38 %
MCH: 32.9 pg (ref 27.2–33.4)
MCHC: 33.1 g/dL (ref 32.0–36.0)
MCV: 99.4 fL — ABNORMAL HIGH (ref 79.3–98.0)
MONOS PCT: 5 %
Monocytes Absolute: 0.2 10*3/uL (ref 0.1–0.9)
NEUTROS ABS: 2.3 10*3/uL (ref 1.5–6.5)
NEUTROS PCT: 55 %
Platelet Count: 133 10*3/uL — ABNORMAL LOW (ref 140–400)
RBC: 3.22 MIL/uL — AB (ref 4.20–5.82)
RDW: 17.8 % — ABNORMAL HIGH (ref 11.0–14.6)
WBC: 4.2 10*3/uL (ref 4.0–10.3)

## 2018-03-07 LAB — CMP (CANCER CENTER ONLY)
ALT: 11 U/L (ref 0–44)
ANION GAP: 10 (ref 5–15)
AST: 16 U/L (ref 15–41)
Albumin: 3.8 g/dL (ref 3.5–5.0)
Alkaline Phosphatase: 71 U/L (ref 38–126)
BUN: 24 mg/dL — ABNORMAL HIGH (ref 8–23)
CHLORIDE: 105 mmol/L (ref 98–111)
CO2: 22 mmol/L (ref 22–32)
Calcium: 8.9 mg/dL (ref 8.9–10.3)
Creatinine: 1.57 mg/dL — ABNORMAL HIGH (ref 0.61–1.24)
GFR, EST NON AFRICAN AMERICAN: 42 mL/min — AB (ref >60)
GFR, Est AFR Am: 49 mL/min — ABNORMAL LOW (ref >60)
Glucose, Bld: 124 mg/dL — ABNORMAL HIGH (ref 70–99)
POTASSIUM: 4.7 mmol/L (ref 3.5–5.1)
SODIUM: 137 mmol/L (ref 135–145)
Total Bilirubin: 0.4 mg/dL (ref 0.3–1.2)
Total Protein: 6.7 g/dL (ref 6.5–8.1)

## 2018-03-07 MED ORDER — PROCHLORPERAZINE MALEATE 10 MG PO TABS
ORAL_TABLET | ORAL | Status: AC
  Filled 2018-03-07: qty 1

## 2018-03-07 MED ORDER — SODIUM CHLORIDE 0.9 % IV SOLN
1000.0000 mg/m2 | Freq: Once | INTRAVENOUS | Status: AC
  Administered 2018-03-07: 2356 mg via INTRAVENOUS
  Filled 2018-03-07: qty 61.96

## 2018-03-07 MED ORDER — SODIUM CHLORIDE 0.9% FLUSH
10.0000 mL | INTRAVENOUS | Status: DC | PRN
  Administered 2018-03-07: 10 mL
  Filled 2018-03-07: qty 10

## 2018-03-07 MED ORDER — PROCHLORPERAZINE MALEATE 10 MG PO TABS
10.0000 mg | ORAL_TABLET | Freq: Once | ORAL | Status: AC
  Administered 2018-03-07: 10 mg via ORAL

## 2018-03-07 MED ORDER — HEPARIN SOD (PORK) LOCK FLUSH 100 UNIT/ML IV SOLN
500.0000 [IU] | Freq: Once | INTRAVENOUS | Status: AC | PRN
  Administered 2018-03-07: 500 [IU]
  Filled 2018-03-07: qty 5

## 2018-03-07 MED ORDER — SODIUM CHLORIDE 0.9 % IV SOLN
Freq: Once | INTRAVENOUS | Status: AC
  Administered 2018-03-07: 09:00:00 via INTRAVENOUS
  Filled 2018-03-07: qty 250

## 2018-03-07 NOTE — Progress Notes (Signed)
Made Dr. Alen Blew aware of patient c/o increased SOB with activity and increased lethargy. Received verbal okay to treat.

## 2018-03-07 NOTE — Patient Instructions (Signed)
[facility] °Discharge Instructions for Patients Receiving Chemotherapy ° °Today you received the following chemotherapy agents Gemzar ° °To help prevent nausea and vomiting after your treatment, we encourage you to take your nausea medication as directed. °  °If you develop nausea and vomiting that is not controlled by your nausea medication, call the clinic.  ° °BELOW ARE SYMPTOMS THAT SHOULD BE REPORTED IMMEDIATELY: °· *FEVER GREATER THAN 100.5 F °· *CHILLS WITH OR WITHOUT FEVER °· NAUSEA AND VOMITING THAT IS NOT CONTROLLED WITH YOUR NAUSEA MEDICATION °· *UNUSUAL SHORTNESS OF BREATH °· *UNUSUAL BRUISING OR BLEEDING °· TENDERNESS IN MOUTH AND THROAT WITH OR WITHOUT PRESENCE OF ULCERS °· *URINARY PROBLEMS °· *BOWEL PROBLEMS °· UNUSUAL RASH °Items with * indicate a potential emergency and should be followed up as soon as possible. ° °Feel free to call the clinic should you have any questions or concerns. The clinic phone number is [phone]. ° °Please show the CHEMO ALERT CARD at check-in to the Emergency Department and triage nurse. ° ° °

## 2018-03-21 ENCOUNTER — Inpatient Hospital Stay (HOSPITAL_BASED_OUTPATIENT_CLINIC_OR_DEPARTMENT_OTHER): Payer: Medicare Other | Admitting: Oncology

## 2018-03-21 ENCOUNTER — Inpatient Hospital Stay: Payer: Medicare Other

## 2018-03-21 ENCOUNTER — Telehealth: Payer: Self-pay | Admitting: Oncology

## 2018-03-21 VITALS — BP 129/69 | HR 70 | Temp 98.1°F | Resp 17 | Ht 69.5 in | Wt 256.4 lb

## 2018-03-21 DIAGNOSIS — C679 Malignant neoplasm of bladder, unspecified: Secondary | ICD-10-CM

## 2018-03-21 DIAGNOSIS — Z95828 Presence of other vascular implants and grafts: Secondary | ICD-10-CM

## 2018-03-21 DIAGNOSIS — D709 Neutropenia, unspecified: Secondary | ICD-10-CM

## 2018-03-21 DIAGNOSIS — Z5111 Encounter for antineoplastic chemotherapy: Secondary | ICD-10-CM | POA: Diagnosis not present

## 2018-03-21 LAB — CMP (CANCER CENTER ONLY)
ALBUMIN: 3.8 g/dL (ref 3.5–5.0)
ALK PHOS: 66 U/L (ref 38–126)
ALT: 7 U/L (ref 0–44)
ANION GAP: 8 (ref 5–15)
AST: 14 U/L — ABNORMAL LOW (ref 15–41)
BILIRUBIN TOTAL: 0.3 mg/dL (ref 0.3–1.2)
BUN: 23 mg/dL (ref 8–23)
CO2: 24 mmol/L (ref 22–32)
Calcium: 9.1 mg/dL (ref 8.9–10.3)
Chloride: 106 mmol/L (ref 98–111)
Creatinine: 1.52 mg/dL — ABNORMAL HIGH (ref 0.61–1.24)
GFR, Est AFR Am: 51 mL/min — ABNORMAL LOW (ref >60)
GFR, Estimated: 44 mL/min — ABNORMAL LOW (ref >60)
Glucose, Bld: 126 mg/dL — ABNORMAL HIGH (ref 70–99)
Potassium: 4.3 mmol/L (ref 3.5–5.1)
SODIUM: 138 mmol/L (ref 135–145)
TOTAL PROTEIN: 6.4 g/dL — AB (ref 6.5–8.1)

## 2018-03-21 LAB — CBC WITH DIFFERENTIAL (CANCER CENTER ONLY)
BASOS PCT: 1 %
Basophils Absolute: 0 10*3/uL (ref 0.0–0.1)
Eosinophils Absolute: 0 10*3/uL (ref 0.0–0.5)
Eosinophils Relative: 1 %
HEMATOCRIT: 30.4 % — AB (ref 38.4–49.9)
HEMOGLOBIN: 10.2 g/dL — AB (ref 13.0–17.1)
LYMPHS ABS: 1.1 10*3/uL (ref 0.9–3.3)
Lymphocytes Relative: 31 %
MCH: 34.3 pg — ABNORMAL HIGH (ref 27.2–33.4)
MCHC: 33.6 g/dL (ref 32.0–36.0)
MCV: 102.4 fL — ABNORMAL HIGH (ref 79.3–98.0)
MONOS PCT: 14 %
Monocytes Absolute: 0.5 10*3/uL (ref 0.1–0.9)
NEUTROS ABS: 1.9 10*3/uL (ref 1.5–6.5)
NEUTROS PCT: 53 %
Platelet Count: 142 10*3/uL (ref 140–400)
RBC: 2.97 MIL/uL — AB (ref 4.20–5.82)
RDW: 19.3 % — ABNORMAL HIGH (ref 11.0–14.6)
WBC: 3.6 10*3/uL — AB (ref 4.0–10.3)

## 2018-03-21 MED ORDER — POTASSIUM CHLORIDE 2 MEQ/ML IV SOLN
Freq: Once | INTRAVENOUS | Status: AC
  Administered 2018-03-21: 10:00:00 via INTRAVENOUS
  Filled 2018-03-21: qty 10

## 2018-03-21 MED ORDER — SODIUM CHLORIDE 0.9% FLUSH
10.0000 mL | INTRAVENOUS | Status: DC | PRN
  Administered 2018-03-21: 10 mL
  Filled 2018-03-21: qty 10

## 2018-03-21 MED ORDER — SODIUM CHLORIDE 0.9 % IV SOLN
Freq: Once | INTRAVENOUS | Status: AC
  Administered 2018-03-21: 10:00:00 via INTRAVENOUS
  Filled 2018-03-21: qty 250

## 2018-03-21 MED ORDER — HEPARIN SOD (PORK) LOCK FLUSH 100 UNIT/ML IV SOLN
500.0000 [IU] | Freq: Once | INTRAVENOUS | Status: AC | PRN
Start: 2018-03-21 — End: 2018-03-21
  Administered 2018-03-21: 500 [IU]
  Filled 2018-03-21: qty 5

## 2018-03-21 MED ORDER — SODIUM CHLORIDE 0.9 % IV SOLN
Freq: Once | INTRAVENOUS | Status: AC
  Administered 2018-03-21: 13:00:00 via INTRAVENOUS
  Filled 2018-03-21: qty 5

## 2018-03-21 MED ORDER — PALONOSETRON HCL INJECTION 0.25 MG/5ML
INTRAVENOUS | Status: AC
  Filled 2018-03-21: qty 5

## 2018-03-21 MED ORDER — SODIUM CHLORIDE 0.9 % IV SOLN
1000.0000 mg/m2 | Freq: Once | INTRAVENOUS | Status: AC
  Administered 2018-03-21: 2356 mg via INTRAVENOUS
  Filled 2018-03-21: qty 62

## 2018-03-21 MED ORDER — PALONOSETRON HCL INJECTION 0.25 MG/5ML
0.2500 mg | Freq: Once | INTRAVENOUS | Status: AC
  Administered 2018-03-21: 0.25 mg via INTRAVENOUS

## 2018-03-21 MED ORDER — SODIUM CHLORIDE 0.9 % IV SOLN
50.0000 mg/m2 | Freq: Once | INTRAVENOUS | Status: AC
  Administered 2018-03-21: 119 mg via INTRAVENOUS
  Filled 2018-03-21: qty 119

## 2018-03-21 NOTE — Patient Instructions (Signed)
[facility] Discharge Instructions for Patients Receiving Chemotherapy  Today you received the following chemotherapy agents cisplatin,gemzar  To help prevent nausea and vomiting after your treatment, we encourage you to take your nausea medication as directed   If you develop nausea and vomiting that is not controlled by your nausea medication, call the clinic.   BELOW ARE SYMPTOMS THAT SHOULD BE REPORTED IMMEDIATELY:  *FEVER GREATER THAN 100.5 F  *CHILLS WITH OR WITHOUT FEVER  NAUSEA AND VOMITING THAT IS NOT CONTROLLED WITH YOUR NAUSEA MEDICATION  *UNUSUAL SHORTNESS OF BREATH  *UNUSUAL BRUISING OR BLEEDING  TENDERNESS IN MOUTH AND THROAT WITH OR WITHOUT PRESENCE OF ULCERS  *URINARY PROBLEMS  *BOWEL PROBLEMS  UNUSUAL RASH Items with * indicate a potential emergency and should be followed up as soon as possible.  Feel free to call the clinic should you have any questions or concerns. The clinic phone number is [phone].  Please show the [facility] at check-in to the Emergency Department and triage nurse.

## 2018-03-21 NOTE — Progress Notes (Signed)
Per Dr Alen Blew ok to tx today with Creatinine of 1.52

## 2018-03-21 NOTE — Progress Notes (Signed)
Hematology and Oncology Follow Up Visit  Nathaniel Kirk [id number] Oct 09, 1945 72 y.o. 03/21/2018 9:24 AM Willey Blade, MDShelton, [name], MD   Principle Diagnosis: 72 year old man with bladder cancer diagnosed in February 2019.  He presented with stage IVa high-grade urothelial carcinoma with a pelvic adenopathy only.  Prior Therapy: He is status post TURBT January 2019 and repeated in October 22, 2017.  Pathology showed high-grade urothelial carcinoma with muscle invasion indicating at least T2 disease.  Current therapy: Neoadjuvant chemotherapy utilizing gemcitabine and cisplatin chemotherapy started in Dec 05, 2017.  He is receiving gemcitabine and cisplatin on day 1 with gemcitabine on day 8 out of a 21-day cycle.  Cisplatin dose was reduced because of renal consideration and he is here for cycle 6 of therapy.  Interim History: [name] is here for a follow-up with his daughter.  Since last visit, he completed cycle 5 of chemotherapy without any major complications.  He did report some mild fatigue associated with day 1 of therapy but no nausea, vomiting or infusion related complications.  He denies any worsening neuropathy or hearing deficits.  He remains active and attends activities of daily living.  His appetite is excellent and have gained weight.   He does not report any headaches, blurry vision, syncope or seizures.  He denies any dizziness or unsteadiness.  Does not report any fevers, chills or sweats.  Does not report any cough, wheezing or hemoptysis.  Does not report any chest pain, palpitation, orthopnea or leg edema.  He does not report any change in his bowel habits or early satiety.  He denies any hematochezia or melena.  Does not report any joint pain or deformity.  Does not report frequency, urgency or hematuria.  Does not report any skin rashes or lesions.  Denies any bleeding or clotting tendencies.  He denies any anxiety or depression.  He denies any heat or cold  intolerance.  Remaining review of systems is negative.    Medications: I have reviewed the patient's current medications.  Current Outpatient Medications  Medication Sig Dispense Refill  . buPROPion (WELLBUTRIN) 100 MG tablet     . cetirizine-pseudoephedrine (ZYRTEC-D) 5-120 MG tablet Take 1 tablet by mouth 2 (two) times daily as needed for allergies.    Marland Kitchen lidocaine-prilocaine (EMLA) cream Apply 1 application topically as needed. 30 g 0  . naproxen sodium (ALEVE) 220 MG tablet Take 220 mg by mouth 2 (two) times daily as needed (for knee pain/arthritis pain.).     Marland Kitchen prochlorperazine (COMPAZINE) 10 MG tablet Take 1 tablet (10 mg total) by mouth every 6 (six) hours as needed for nausea or vomiting. 30 tablet 3   No current facility-administered medications for this visit.    Facility-Administered Medications Ordered in Other Visits  Medication Dose Route Frequency Provider Last Rate Last Dose  . 0.9 %  sodium chloride infusion   Intravenous Once Truitt Merle, MD         Allergies: No Known Allergies  Past Medical History, Surgical history, Social history, and Family History were reviewed and updated.    Physical Exam:  Blood pressure 129/69, pulse 70, temperature 98.1 F (36.7 C), temperature source Oral, resp. rate 17, height 5' 9.5" (1.765 m), weight 256 lb 6.4 oz (116.3 kg), SpO2 99 %.    ECOG: 0    General appearance: Alert, awake without any distress. Head:  Normocephalic without any masses or lesions. Oropharynx: Without any thrush or ulcers. Eyes: No scleral icterus. Lymph nodes: No lymphadenopathy noted in  the cervical, supraclavicular,  inguinal or axillary or  nodes Heart:regular rate and rhythm, without any murmurs or gallops.   Lung: Clear to auscultation without any rhonchi, wheezes or dullness to percussion. Abdomin: Soft, nontender without any shifting dullness or ascites. Musculoskeletal: No clubbing or cyanosis. Neurological: No motor or sensory  deficits. Skin: No rashes or lesions. Psychiatric: Mood and affect appeared normal.         Lab Results: Lab Results  Component Value Date   WBC 3.6 (L) 03/21/2018   HGB 10.2 (L) 03/21/2018   HCT 30.4 (L) 03/21/2018   MCV 102.4 (H) 03/21/2018   PLT 142 03/21/2018     Chemistry      Component Value Date/Time   NA 137 03/07/2018 0804   K 4.7 03/07/2018 0804   CL 105 03/07/2018 0804   CO2 22 03/07/2018 0804   BUN 24 (H) 03/07/2018 0804   CREATININE 1.57 (H) 03/07/2018 0804      Component Value Date/Time   CALCIUM 8.9 03/07/2018 0804   ALKPHOS 71 03/07/2018 0804   AST 16 03/07/2018 0804   ALT 11 03/07/2018 0804   BILITOT 0.4 03/07/2018 0804       Impression and Plan:   71 year old man with the:  1.    Bladder cancer diagnosed in February 2019 after presenting with stage IVa high-grade urothelial carcinoma with pelvic adenopathy.  He is currently receiving any adjuvant chemotherapy utilizing cisplatin and gemcitabine.  He is ready to proceed with cycle 6 of therapy which will be his last cycle.  Risks and benefits of this chemotherapy continuation and long-term toxicities were reviewed and is agreeable to proceed.  Plan is to have repeat imaging studies and potentially salvage cystoprostatectomy tentatively scheduled in October 2019 at [facility].  2.  IV access: Port-A-Cath remains in use and in place without complications.  3.  Antiemetics: No issues reported with nausea and vomiting.  Compazine is available to him.  4.  Renal function surveillance: His creatinine clearance remains stable on reduced dose of cisplatin.  His creatinine clearance is around 50 cc/min and his cisplatin dose remains at 50 mg/m.  6.  Neutropenia: His absolute neutrophil count is 1900 and will proceed with chemotherapy today.  7.  Follow-up: We will be in 1 week for day 8 of cycle cycle 6 of therapy.  I will check him in few weeks to check for any late toxicities related to  chemotherapy.  25 minutes was spent with the patient face-to-face today.  More than 50% of time was dedicated to reviewing the natural course of his disease, managing toxicities related to chemotherapy and coordinating his future plan of care.    Zola Button, MD 8/29/20199:24 AM

## 2018-03-21 NOTE — Telephone Encounter (Signed)
Appts scheduled AVS/Calendar printed per 8/29 los °

## 2018-03-28 ENCOUNTER — Inpatient Hospital Stay: Payer: Medicare Other

## 2018-03-28 ENCOUNTER — Inpatient Hospital Stay: Payer: Medicare Other | Attending: Oncology

## 2018-03-28 ENCOUNTER — Other Ambulatory Visit: Payer: Medicare Other

## 2018-03-28 VITALS — BP 151/80 | HR 69 | Temp 97.9°F | Resp 18 | Ht 69.5 in | Wt 250.0 lb

## 2018-03-28 DIAGNOSIS — I2699 Other pulmonary embolism without acute cor pulmonale: Secondary | ICD-10-CM | POA: Diagnosis not present

## 2018-03-28 DIAGNOSIS — Z95828 Presence of other vascular implants and grafts: Secondary | ICD-10-CM | POA: Insufficient documentation

## 2018-03-28 DIAGNOSIS — C679 Malignant neoplasm of bladder, unspecified: Secondary | ICD-10-CM

## 2018-03-28 DIAGNOSIS — Z5111 Encounter for antineoplastic chemotherapy: Secondary | ICD-10-CM | POA: Insufficient documentation

## 2018-03-28 DIAGNOSIS — Z79899 Other long term (current) drug therapy: Secondary | ICD-10-CM | POA: Insufficient documentation

## 2018-03-28 LAB — CMP (CANCER CENTER ONLY)
ALT: 12 U/L (ref 0–44)
ANION GAP: 10 (ref 5–15)
AST: 17 U/L (ref 15–41)
Albumin: 4.2 g/dL (ref 3.5–5.0)
Alkaline Phosphatase: 65 U/L (ref 38–126)
BILIRUBIN TOTAL: 0.4 mg/dL (ref 0.3–1.2)
BUN: 30 mg/dL — ABNORMAL HIGH (ref 8–23)
CHLORIDE: 105 mmol/L (ref 98–111)
CO2: 24 mmol/L (ref 22–32)
Calcium: 9.6 mg/dL (ref 8.9–10.3)
Creatinine: 1.85 mg/dL — ABNORMAL HIGH (ref 0.61–1.24)
GFR, EST AFRICAN AMERICAN: 40 mL/min — AB (ref >60)
GFR, EST NON AFRICAN AMERICAN: 34 mL/min — AB (ref >60)
Glucose, Bld: 109 mg/dL — ABNORMAL HIGH (ref 70–99)
POTASSIUM: 4.8 mmol/L (ref 3.5–5.1)
Sodium: 139 mmol/L (ref 135–145)
TOTAL PROTEIN: 7.1 g/dL (ref 6.5–8.1)

## 2018-03-28 LAB — CBC WITH DIFFERENTIAL (CANCER CENTER ONLY)
BASOS ABS: 0 10*3/uL (ref 0.0–0.1)
Basophils Relative: 1 %
EOS PCT: 1 %
Eosinophils Absolute: 0 10*3/uL (ref 0.0–0.5)
HEMATOCRIT: 30.3 % — AB (ref 38.4–49.9)
HEMOGLOBIN: 10.1 g/dL — AB (ref 13.0–17.1)
LYMPHS ABS: 2 10*3/uL (ref 0.9–3.3)
LYMPHS PCT: 41 %
MCH: 34.4 pg — ABNORMAL HIGH (ref 27.2–33.4)
MCHC: 33.3 g/dL (ref 32.0–36.0)
MCV: 103.1 fL — AB (ref 79.3–98.0)
Monocytes Absolute: 0.2 10*3/uL (ref 0.1–0.9)
Monocytes Relative: 5 %
NEUTROS ABS: 2.6 10*3/uL (ref 1.5–6.5)
NEUTROS PCT: 52 %
PLATELETS: 132 10*3/uL — AB (ref 140–400)
RBC: 2.94 MIL/uL — AB (ref 4.20–5.82)
RDW: 17 % — ABNORMAL HIGH (ref 11.0–14.6)
WBC: 4.8 10*3/uL (ref 4.0–10.3)

## 2018-03-28 MED ORDER — HEPARIN SOD (PORK) LOCK FLUSH 100 UNIT/ML IV SOLN
500.0000 [IU] | Freq: Once | INTRAVENOUS | Status: AC | PRN
  Administered 2018-03-28: 500 [IU]
  Filled 2018-03-28: qty 5

## 2018-03-28 MED ORDER — SODIUM CHLORIDE 0.9% FLUSH
10.0000 mL | INTRAVENOUS | Status: DC | PRN
  Administered 2018-03-28: 10 mL
  Filled 2018-03-28: qty 10

## 2018-03-28 MED ORDER — PROCHLORPERAZINE MALEATE 10 MG PO TABS
ORAL_TABLET | ORAL | Status: AC
  Filled 2018-03-28: qty 1

## 2018-03-28 MED ORDER — PROCHLORPERAZINE MALEATE 10 MG PO TABS
10.0000 mg | ORAL_TABLET | Freq: Once | ORAL | Status: AC
  Administered 2018-03-28: 10 mg via ORAL

## 2018-03-28 MED ORDER — SODIUM CHLORIDE 0.9 % IV SOLN
Freq: Once | INTRAVENOUS | Status: AC
  Administered 2018-03-28: 10:00:00 via INTRAVENOUS
  Filled 2018-03-28: qty 250

## 2018-03-28 MED ORDER — SODIUM CHLORIDE 0.9 % IV SOLN
1000.0000 mg/m2 | Freq: Once | INTRAVENOUS | Status: AC
  Administered 2018-03-28: 2356 mg via INTRAVENOUS
  Filled 2018-03-28: qty 61.96

## 2018-03-28 NOTE — Patient Instructions (Signed)
[facility] Discharge Instructions for Patients Receiving Chemotherapy  Today you received the following chemotherapy agents: Gemcitabine (Gemzar)  To help prevent nausea and vomiting after your treatment, we encourage you to take your nausea medication as prescribed.    If you develop nausea and vomiting that is not controlled by your nausea medication, call the clinic.   BELOW ARE SYMPTOMS THAT SHOULD BE REPORTED IMMEDIATELY:  *FEVER GREATER THAN 100.5 F  *CHILLS WITH OR WITHOUT FEVER  NAUSEA AND VOMITING THAT IS NOT CONTROLLED WITH YOUR NAUSEA MEDICATION  *UNUSUAL SHORTNESS OF BREATH  *UNUSUAL BRUISING OR BLEEDING  TENDERNESS IN MOUTH AND THROAT WITH OR WITHOUT PRESENCE OF ULCERS  *URINARY PROBLEMS  *BOWEL PROBLEMS  UNUSUAL RASH Items with * indicate a potential emergency and should be followed up as soon as possible.  Feel free to call the clinic should you have any questions or concerns. The clinic phone number is [phone].  Please show the CHEMO ALERT CARD at check-in to the Emergency Department and triage nurse.   

## 2018-03-28 NOTE — Progress Notes (Signed)
Per Dr. Alen Blew: OK to treat with creatinine of 1.85 and other lab work today

## 2018-04-11 MED ORDER — ENOXAPARIN SODIUM 120 MG/0.8ML ~~LOC~~ SOLN
1.00 | SUBCUTANEOUS | Status: DC
Start: 2018-04-11 — End: 2018-04-11

## 2018-04-11 MED ORDER — PROCHLORPERAZINE MALEATE 5 MG PO TABS
5.00 | ORAL_TABLET | ORAL | Status: DC
Start: ? — End: 2018-04-11

## 2018-04-11 MED ORDER — LIDOCAINE HCL (PF) 1 % IJ SOLN
0.50 | INTRAMUSCULAR | Status: DC
Start: ? — End: 2018-04-11

## 2018-04-11 MED ORDER — FEXOFENADINE HCL 60 MG PO TABS
60.00 | ORAL_TABLET | ORAL | Status: DC
Start: ? — End: 2018-04-11

## 2018-04-11 MED ORDER — PSEUDOEPHEDRINE HCL ER 120 MG PO TB12
120.00 | ORAL_TABLET | ORAL | Status: DC
Start: ? — End: 2018-04-11

## 2018-04-11 MED ORDER — IPRATROPIUM-ALBUTEROL 0.5-2.5 (3) MG/3ML IN SOLN
3.00 | RESPIRATORY_TRACT | Status: DC
Start: ? — End: 2018-04-11

## 2018-04-11 MED ORDER — GENERIC EXTERNAL MEDICATION
3.00 | Status: DC
Start: ? — End: 2018-04-11

## 2018-04-16 ENCOUNTER — Inpatient Hospital Stay (HOSPITAL_BASED_OUTPATIENT_CLINIC_OR_DEPARTMENT_OTHER): Payer: Medicare Other | Admitting: Oncology

## 2018-04-16 ENCOUNTER — Inpatient Hospital Stay: Payer: Medicare Other

## 2018-04-16 VITALS — BP 145/70 | HR 70 | Temp 98.2°F | Resp 17 | Ht 69.5 in | Wt 253.6 lb

## 2018-04-16 DIAGNOSIS — I2699 Other pulmonary embolism without acute cor pulmonale: Secondary | ICD-10-CM

## 2018-04-16 DIAGNOSIS — C679 Malignant neoplasm of bladder, unspecified: Secondary | ICD-10-CM

## 2018-04-16 DIAGNOSIS — Z95828 Presence of other vascular implants and grafts: Secondary | ICD-10-CM

## 2018-04-16 DIAGNOSIS — Z5111 Encounter for antineoplastic chemotherapy: Secondary | ICD-10-CM | POA: Diagnosis not present

## 2018-04-16 LAB — CMP (CANCER CENTER ONLY)
ALK PHOS: 64 U/L (ref 38–126)
ALT: 16 U/L (ref 0–44)
ANION GAP: 6 (ref 5–15)
AST: 19 U/L (ref 15–41)
Albumin: 3.8 g/dL (ref 3.5–5.0)
BILIRUBIN TOTAL: 0.2 mg/dL — AB (ref 0.3–1.2)
BUN: 22 mg/dL (ref 8–23)
CO2: 26 mmol/L (ref 22–32)
CREATININE: 1.72 mg/dL — AB (ref 0.61–1.24)
Calcium: 9.1 mg/dL (ref 8.9–10.3)
Chloride: 108 mmol/L (ref 98–111)
GFR, EST AFRICAN AMERICAN: 44 mL/min — AB (ref >60)
GFR, EST NON AFRICAN AMERICAN: 38 mL/min — AB (ref >60)
Glucose, Bld: 125 mg/dL — ABNORMAL HIGH (ref 70–99)
Potassium: 4.3 mmol/L (ref 3.5–5.1)
SODIUM: 140 mmol/L (ref 135–145)
TOTAL PROTEIN: 6.6 g/dL (ref 6.5–8.1)

## 2018-04-16 LAB — CBC WITH DIFFERENTIAL (CANCER CENTER ONLY)
BASOS PCT: 1 %
Basophils Absolute: 0 10*3/uL (ref 0.0–0.1)
EOS ABS: 0.1 10*3/uL (ref 0.0–0.5)
Eosinophils Relative: 1 %
HCT: 31.1 % — ABNORMAL LOW (ref 38.4–49.9)
Hemoglobin: 10.2 g/dL — ABNORMAL LOW (ref 13.0–17.1)
Lymphocytes Relative: 42 %
Lymphs Abs: 1.8 10*3/uL (ref 0.9–3.3)
MCH: 35.3 pg — ABNORMAL HIGH (ref 27.2–33.4)
MCHC: 32.8 g/dL (ref 32.0–36.0)
MCV: 107.6 fL — ABNORMAL HIGH (ref 79.3–98.0)
Monocytes Absolute: 0.5 10*3/uL (ref 0.1–0.9)
Monocytes Relative: 12 %
NEUTROS ABS: 1.9 10*3/uL (ref 1.5–6.5)
Neutrophils Relative %: 44 %
PLATELETS: 252 10*3/uL (ref 140–400)
RBC: 2.89 MIL/uL — AB (ref 4.20–5.82)
RDW: 16.3 % — ABNORMAL HIGH (ref 11.0–14.6)
WBC: 4.3 10*3/uL (ref 4.0–10.3)

## 2018-04-16 MED ORDER — HEPARIN SOD (PORK) LOCK FLUSH 100 UNIT/ML IV SOLN
500.0000 [IU] | Freq: Once | INTRAVENOUS | Status: AC | PRN
  Administered 2018-04-16: 500 [IU]
  Filled 2018-04-16: qty 5

## 2018-04-16 MED ORDER — SODIUM CHLORIDE 0.9% FLUSH
10.0000 mL | INTRAVENOUS | Status: DC | PRN
  Administered 2018-04-16: 10 mL
  Filled 2018-04-16: qty 10

## 2018-04-16 NOTE — Progress Notes (Signed)
Hematology and Oncology Follow Up Visit  Nathaniel Kirk [id number] 18-Oct-1945 72 y.o. 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  He has limited pelvic adenopathy at the time of diagnosis.  Prior Therapy: He is status post TURBT January 2019 and repeated in October 22, 2017.  Pathology showed high-grade urothelial carcinoma with muscle invasion indicating at least T2 disease.  Neoadjuvant chemotherapy utilizing gemcitabine and cisplatin chemotherapy started in Dec 05, 2017.  He completed 4 cycles of therapy in August 2019  Current therapy: Under evaluation for radical cystectomy.  Interim History: [name] presents today for a follow-up by himself.  Since last visit, he underwent staging work-up including CT scan chest abdomen and pelvis in preparation for radical cystectomy.  Incidentally, he was found to have bilateral pulmonary embolism and has been started on Lovenox.  He also had an IVC filter placement in preparation for radical cystectomy in the next 4 weeks.  Clinically, he reports no symptoms of chest pain or shortness of breath.  He has fully recovered from his chemotherapy.  He is eating better and has resumed all activities of daily living.   He does not report any headaches, blurry vision, syncope or seizures. Does not report any fevers, chills or sweats.  Does not report any cough, wheezing or hemoptysis.  Does not report any chest pain, palpitation, orthopnea or leg edema.  He denies any nausea, vomiting or change in his bowel habits.  Does not report any arthralgias or myalgias.  Does not report frequency, urgency or hematuria.  Does not report any skin rashes or lesions.  Denies any lymphadenopathy or petechiae.  He denies any mood changes.  Remaining review of systems is negative.    Medications: I have reviewed the patient's current  medications.  Current Outpatient Medications  Medication Sig Dispense Refill  . buPROPion (WELLBUTRIN) 100 MG tablet     . cetirizine-pseudoephedrine (ZYRTEC-D) 5-120 MG tablet Take 1 tablet by mouth 2 (two) times daily as needed for allergies.    Marland Kitchen enoxaparin (LOVENOX) 120 MG/0.8ML injection Inject 120 mg into the skin every 12 (twelve) hours.    . lidocaine-prilocaine (EMLA) cream Apply 1 application topically as needed. 30 g 0  . naproxen sodium (ALEVE) 220 MG tablet Take 220 mg by mouth 2 (two) times daily as needed (for knee pain/arthritis pain.).     Marland Kitchen prochlorperazine (COMPAZINE) 10 MG tablet Take 1 tablet (10 mg total) by mouth every 6 (six) hours as needed for nausea or vomiting. 30 tablet 3   No current facility-administered medications for this visit.    Facility-Administered Medications Ordered in Other Visits  Medication Dose Route Frequency Provider Last Rate Last Dose  . 0.9 %  sodium chloride infusion   Intravenous Once Truitt Merle, MD         Allergies: No Known Allergies  Past Medical History, Surgical history, Social history, and Family History were reviewed and updated.    Physical Exam:  Blood pressure (!) 145/70, pulse 70, temperature 98.2 F (36.8 C), temperature source Oral, resp. rate 17, height 5' 9.5" (1.765 m), weight 253 lb 9.6 oz (115 kg), SpO2 100 %.    ECOG: 0    General appearance: Comfortable appearing without any discomfort Head: Normocephalic without any trauma Oropharynx: Mucous membranes are moist and pink without any thrush or ulcers. Eyes: Pupils are equal and round reactive to light. Lymph  nodes: No cervical, supraclavicular, inguinal or axillary lymphadenopathy.   Heart:regular rate and rhythm.  S1 and S2 without leg edema. Lung: Clear without any rhonchi or wheezes.  No dullness to percussion. Abdomin: Soft, nontender, nondistended with good bowel sounds.  No hepatosplenomegaly. Musculoskeletal: No joint deformity or effusion.  Full  range of motion noted. Neurological: No deficits noted on motor, sensory and deep tendon reflex exam. Skin: No petechial rash or dryness.  Appeared moist.          Lab Results: Lab Results  Component Value Date   WBC 4.3 04/16/2018   HGB 10.2 (L) 04/16/2018   HCT 31.1 (L) 04/16/2018   MCV 107.6 (H) 04/16/2018   PLT 252 04/16/2018     Chemistry      Component Value Date/Time   NA 139 03/28/2018 0821   K 4.8 03/28/2018 0821   CL 105 03/28/2018 0821   CO2 24 03/28/2018 0821   BUN 30 (H) 03/28/2018 0821   CREATININE 1.85 (H) 03/28/2018 0821      Component Value Date/Time   CALCIUM 9.6 03/28/2018 0821   ALKPHOS 65 03/28/2018 0821   AST 17 03/28/2018 0821   ALT 12 03/28/2018 0821   BILITOT 0.4 03/28/2018 0821       Impression and Plan:   72 year old man with the:  1.    Stage IVa high-grade urothelial carcinoma with pelvic adenopathy diagnosed in February 2019.  He completed neoadjuvant chemotherapy utilizing cisplatin and gemcitabine without any complications.  CT scan obtained in September 2019 at [facility] showed no evidence of residual disease.  The plan is to undergo radical cystectomy scheduled for October of this year.  Upon completing his surgery he will return for follow-up and possible surveillance after that.  2.  IV access: Port-A-Cath will be flushed periodically and potentially removed after surgery.  3.    Pulmonary embolism: Incidentally found on staging CT scan.  He is currently taking Lovenox managed by his providers at [facility].  He has an IVC filter placed as well.  4.  Renal function surveillance: His kidney function will be monitored periodically and creatinine remained stable around 1.7.    5.  Follow-up: We will be 2 months to follow his progress.  15 minutes was spent with the patient face-to-face today.  More than 50% of time was dedicated to discussing his CT scan, treatment options and  coordinating his plan of care.    Zola Button, MD 9/24/20192:26 PM

## 2018-04-17 ENCOUNTER — Ambulatory Visit: Payer: Medicare Other | Admitting: Vascular Surgery

## 2018-04-17 ENCOUNTER — Ambulatory Visit (HOSPITAL_COMMUNITY)
Admission: RE | Admit: 2018-04-17 | Discharge: 2018-04-17 | Disposition: A | Discharge status: Home / Self Care | Payer: Medicare Other | Source: Ambulatory Visit | Attending: Vascular Surgery | Admitting: Vascular Surgery

## 2018-04-17 DIAGNOSIS — Z9889 Other specified postprocedural states: Secondary | ICD-10-CM | POA: Insufficient documentation

## 2018-04-17 DIAGNOSIS — I714 Abdominal aortic aneurysm, without rupture, unspecified: Secondary | ICD-10-CM

## 2018-04-17 DIAGNOSIS — F17200 Nicotine dependence, unspecified, uncomplicated: Secondary | ICD-10-CM | POA: Diagnosis not present

## 2018-04-17 DIAGNOSIS — E669 Obesity, unspecified: Secondary | ICD-10-CM | POA: Diagnosis not present

## 2018-04-23 ENCOUNTER — Encounter: Payer: Self-pay | Admitting: Vascular Surgery

## 2018-04-23 ENCOUNTER — Other Ambulatory Visit: Payer: Self-pay

## 2018-04-23 ENCOUNTER — Ambulatory Visit (INDEPENDENT_AMBULATORY_CARE_PROVIDER_SITE_OTHER): Payer: Medicare Other | Admitting: Vascular Surgery

## 2018-04-23 VITALS — BP 115/77 | HR 43 | Temp 97.0°F | Resp 20 | Ht 69.5 in | Wt 252.2 lb

## 2018-04-23 DIAGNOSIS — I714 Abdominal aortic aneurysm, without rupture, unspecified: Secondary | ICD-10-CM

## 2018-04-23 NOTE — Progress Notes (Signed)
Patient name: Nathaniel Kirk MRN: [id number] DOB: August 28, 1945 Sex: male  REASON FOR VISIT: 80-month follow-up status post EVAR with bilateral iliac branch device  HPI: Nathaniel Kirk is a 72 y.o. 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  Patient states he was recently diagnosed with PEs at [facility] and is now on treatment dose Lovenox and also underwent filter placement.  He has completed his neoadjuvant chemotherapy.  He is scheduled for a cystectomy and prostatectomy at the end of October at [facility].  He denies any new abdominal pain or back pain.  Overall he states his energy level is improved since completing chemotherapy.  His CT at one month showed stable aneurysms and no endoleak.  Dr. Bridgett Larsson had scheduled him for EVAR duplex today.   Past Medical History:  Diagnosis Date  . Arthritis    knees  . Bladder tumor    removed 08/31/17  post op bleeding   . Cancer [facility])    Bladder  . 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 surgery scheduled with Dr. Bridgett Larsson 09-10-17  . Pneumonia    walking PNA  2016 or 2017  . UTI (urinary tract infection)     Past Surgical History:  Procedure Laterality Date  . ABDOMINAL AORTIC ENDOVASCULAR STENT GRAFT N/A 09/10/2017   Procedure: ABDOMINAL AORTIC ENDOVASCULAR STENT GRAFT WITH RIGHT AND  LEFT ILIAC BRANCH EXTENSIONS;  Surgeon: Conrad McNary, MD;  Location: [place];  Service: Vascular;  Laterality: N/A;  . COLON SURGERY     diverticulitis foot of colon out  . CYSTOSCOPY W/ RETROGRADES Bilateral 08/31/2017   Procedure: CYSTOSCOPY WITH RETROGRADE PYELOGRAM;  Surgeon: Cleon Gustin, MD;  Location: [facility];  Service: Urology;  Laterality: Bilateral;  . HERNIA REPAIR     umbilical  . IR FLUORO GUIDE PORT INSERTION RIGHT  12/11/2017  . IR US  GUIDE VASC ACCESS RIGHT  12/11/2017  . TONSILLECTOMY     as kid  . TRANSURETHRAL RESECTION OF BLADDER TUMOR N/A 08/31/2017   Procedure: TRANSURETHRAL RESECTION OF BLADDER TUMOR (TURBT);  Surgeon: Cleon Gustin, MD;  Location: [facility];  Service: Urology;  Laterality: N/A;    History reviewed. No pertinent family history.  SOCIAL HISTORY: Social History   Tobacco Use  . Smoking status: Current Every Day Smoker    Years: 40.00  . Smokeless tobacco: Never Used  . Tobacco comment: 1-4 cigarettes per day  Substance Use Topics  . Alcohol use: Yes    Comment: special occasions    No Known Allergies  Current Outpatient Medications  Medication Sig Dispense Refill  . buPROPion (WELLBUTRIN) 100 MG tablet     . cetirizine-pseudoephedrine (ZYRTEC-D) 5-120 MG tablet Take 1 tablet by mouth 2 (two) times daily as needed for allergies.    Marland Kitchen enoxaparin (LOVENOX) 120 MG/0.8ML injection Inject 120 mg into the skin every 12 (twelve) hours.    . lidocaine-prilocaine (EMLA) cream Apply 1 application topically as needed. 30 g 0  . naproxen sodium (ALEVE) 220 MG tablet Take 220 mg by mouth 2 (two) times daily as needed (for knee pain/arthritis pain.).     Marland Kitchen prochlorperazine (COMPAZINE) 10 MG tablet Take 1 tablet (10 mg total) by mouth every 6 (six) hours as needed for nausea or vomiting. 30 tablet 3   No current  facility-administered medications for this visit.    Facility-Administered Medications Ordered in Other Visits  Medication Dose Route Frequency Provider Last Rate Last Dose  . 0.9 %  sodium chloride infusion   Intravenous Once Truitt Merle, MD        REVIEW OF SYSTEMS:  [X]  denotes positive finding, [ ]  denotes negative finding Cardiac  Comments:  Chest pain or chest pressure:    Shortness of breath upon exertion: x   Short of breath when lying flat:    Irregular heart rhythm:        Vascular    Pain in calf, thigh, or hip brought on by ambulation:    Pain in feet at night that wakes  you up from your sleep:     Blood clot in your veins:    Leg swelling:         Pulmonary    Oxygen at home:    Productive cough:     Wheezing:         Neurologic    Sudden weakness in arms or legs:     Sudden numbness in arms or legs:     Sudden onset of difficulty speaking or slurred speech:    Temporary loss of vision in one eye:     Problems with dizziness:         Gastrointestinal    Blood in stool:     Vomited blood:         Genitourinary    Burning when urinating:     Blood in urine:        Psychiatric    Major depression:         Hematologic    Bleeding problems:    Problems with blood clotting too easily:        Skin    Rashes or ulcers:        Constitutional    Fever or chills:      PHYSICAL EXAM: Vitals:   04/23/18 0926  BP: 115/77  Pulse: (!) 43  Resp: 20  Temp: (!) 97 F (36.1 C)  TempSrc: Oral  SpO2: 96%  Weight: 114.4 kg  Height: 5' 9.5" (1.765 m)    GENERAL: The patient is a well-nourished male, in no acute distress. The vital signs are documented above. CARDIAC: There is a regular rate and rhythm.  VASCULAR:  2+ radial pulse palpable bilateral upper extremities 2+ femoral pulse palpable bilateral groins 2+ dorsalis pedis palpable bilateral lower extremity PULMONARY: There is good air exchange bilaterally without wheezing or rales. ABDOMEN: Old midline laparotomy incision well-healed with mesh appreciable.  No pain with deep palpation. MUSCULOSKELETAL: There are no major deformities or cyanosis. NEUROLOGIC: No focal weakness or paresthesias are detected. SKIN: There are no ulcers or rashes noted. PSYCHIATRIC: The patient has a normal affect.  DATA:     I independently reviewed his aneurysm duplex that shows a stable 5.4 cm infrarenal aneurysm.  They had difficulty visualizing the entirety of his endograft given his size.  No overt evidence of an endoleak.  I also reviewed his CT status post repair from 6 months ago that shows no  endoleak and a stable 5.4 cm infra-renal aneurysm.  Left CIA aneurysm 5 cm +, left CIA aneurysm 4+ CM.  No endoleak.  Assessment/Plan:  Discussed with Mr. Verde the findings of his surveillance duplex that show no overt endoleak and a stable 5.4 cm infrarenal aneurysm.  We discussed that given his size they had some difficulty  visualizing the entire endograft on duplex, but no evidence of endoleak.  His CTA  At 1 month showed stable infra-renal aneurysm and iliac aneurysms with no evidence of an endoleak.  I will plan to see him back in 6 months with a CTA at that time. He should be ok to proceed with his surgery at [facility] later this month.   Marty Heck, MD [facility] Office: [phone] Pager: [place]

## 2018-05-31 ENCOUNTER — Telehealth: Payer: Self-pay | Admitting: *Deleted

## 2018-05-31 NOTE — Telephone Encounter (Signed)
Daughter Nathaniel Kirk calling to say patient was put on lovenox injections by physicians at [facility]. States he is having pain at the injection site and wants to know if there is a pill he may take? Patient has an appt 06/13/18. Encouraged Nathaniel Kirk to keep regularly scheduled appt and speak with dr Alen Blew re: options then.

## 2018-06-01 ENCOUNTER — Emergency Department (HOSPITAL_COMMUNITY): Payer: Medicare Other

## 2018-06-01 ENCOUNTER — Other Ambulatory Visit: Payer: Self-pay

## 2018-06-01 ENCOUNTER — Emergency Department (HOSPITAL_COMMUNITY)
Admission: EM | Admit: 2018-06-01 | Discharge: 2018-06-01 | Disposition: A | Discharge status: Other Acute Inpatient Hospital | Payer: Medicare Other | Attending: Emergency Medicine | Admitting: Emergency Medicine

## 2018-06-01 DIAGNOSIS — Z79899 Other long term (current) drug therapy: Secondary | ICD-10-CM | POA: Diagnosis not present

## 2018-06-01 DIAGNOSIS — N501 Vascular disorders of male genital organs: Secondary | ICD-10-CM | POA: Diagnosis not present

## 2018-06-01 DIAGNOSIS — Z8551 Personal history of malignant neoplasm of bladder: Secondary | ICD-10-CM

## 2018-06-01 DIAGNOSIS — R0602 Shortness of breath: Secondary | ICD-10-CM | POA: Diagnosis not present

## 2018-06-01 DIAGNOSIS — Z936 Other artificial openings of urinary tract status: Secondary | ICD-10-CM | POA: Insufficient documentation

## 2018-06-01 DIAGNOSIS — R531 Weakness: Secondary | ICD-10-CM | POA: Insufficient documentation

## 2018-06-01 DIAGNOSIS — F17210 Nicotine dependence, cigarettes, uncomplicated: Secondary | ICD-10-CM | POA: Diagnosis not present

## 2018-06-01 DIAGNOSIS — G8918 Other acute postprocedural pain: Secondary | ICD-10-CM | POA: Insufficient documentation

## 2018-06-01 LAB — CBC WITH DIFFERENTIAL/PLATELET
Abs Immature Granulocytes: 0.04 10*3/uL (ref 0.00–0.07)
Basophils Absolute: 0 10*3/uL (ref 0.0–0.1)
Basophils Relative: 0 %
EOS ABS: 0 10*3/uL (ref 0.0–0.5)
Eosinophils Relative: 0 %
HCT: 30.9 % — ABNORMAL LOW (ref 39.0–52.0)
Hemoglobin: 9.8 g/dL — ABNORMAL LOW (ref 13.0–17.0)
IMMATURE GRANULOCYTES: 0 %
LYMPHS ABS: 1.1 10*3/uL (ref 0.7–4.0)
Lymphocytes Relative: 11 %
MCH: 32.5 pg (ref 26.0–34.0)
MCHC: 31.7 g/dL (ref 30.0–36.0)
MCV: 102.3 fL — AB (ref 80.0–100.0)
MONOS PCT: 7 %
Monocytes Absolute: 0.8 10*3/uL (ref 0.1–1.0)
NEUTROS PCT: 82 %
Neutro Abs: 8.3 10*3/uL — ABNORMAL HIGH (ref 1.7–7.7)
Platelets: 239 10*3/uL (ref 150–400)
RBC: 3.02 MIL/uL — ABNORMAL LOW (ref 4.22–5.81)
RDW: 16.1 % — AB (ref 11.5–15.5)
WBC: 10.3 10*3/uL (ref 4.0–10.5)
nRBC: 0 % (ref 0.0–0.2)

## 2018-06-01 LAB — COMPREHENSIVE METABOLIC PANEL
ALBUMIN: 3.5 g/dL (ref 3.5–5.0)
ALT: 23 U/L (ref 0–44)
AST: 18 U/L (ref 15–41)
Alkaline Phosphatase: 51 U/L (ref 38–126)
Anion gap: 7 (ref 5–15)
BUN: 33 mg/dL — AB (ref 8–23)
CO2: 23 mmol/L (ref 22–32)
CREATININE: 2.24 mg/dL — AB (ref 0.61–1.24)
Calcium: 8.9 mg/dL (ref 8.9–10.3)
Chloride: 101 mmol/L (ref 98–111)
GFR calc Af Amer: 32 mL/min — ABNORMAL LOW (ref >60)
GFR calc non Af Amer: 27 mL/min — ABNORMAL LOW (ref >60)
GLUCOSE: 149 mg/dL — AB (ref 70–99)
Potassium: 5.4 mmol/L — ABNORMAL HIGH (ref 3.5–5.1)
Sodium: 131 mmol/L — ABNORMAL LOW (ref 135–145)
TOTAL PROTEIN: 6.2 g/dL — AB (ref 6.5–8.1)
Total Bilirubin: 0.8 mg/dL (ref 0.3–1.2)

## 2018-06-01 LAB — URINALYSIS, ROUTINE W REFLEX MICROSCOPIC
Bilirubin Urine: NEGATIVE
Glucose, UA: NEGATIVE mg/dL
NITRITE: NEGATIVE
PROTEIN: 100 mg/dL — AB
pH: 6 (ref 5.0–8.0)

## 2018-06-01 LAB — URINALYSIS, MICROSCOPIC (REFLEX): SQUAMOUS EPITHELIAL / LPF: NONE SEEN (ref 0–5)

## 2018-06-01 LAB — PROTIME-INR
INR: 1.08
Prothrombin Time: 13.9 seconds (ref 11.4–15.2)

## 2018-06-01 LAB — I-STAT CG4 LACTIC ACID, ED: Lactic Acid, Venous: 1.17 mmol/L (ref 0.5–1.9)

## 2018-06-01 LAB — CBG MONITORING, ED: GLUCOSE-CAPILLARY: 122 mg/dL — AB (ref 70–99)

## 2018-06-01 MED ORDER — MORPHINE SULFATE (PF) 4 MG/ML IV SOLN
4.0000 mg | Freq: Once | INTRAVENOUS | Status: AC
  Administered 2018-06-01: 4 mg via INTRAVENOUS
  Filled 2018-06-01: qty 1

## 2018-06-01 MED ORDER — HYDROMORPHONE HCL 1 MG/ML IJ SOLN
1.0000 mg | Freq: Once | INTRAMUSCULAR | Status: AC
  Administered 2018-06-01: 1 mg via INTRAVENOUS
  Filled 2018-06-01: qty 1

## 2018-06-01 NOTE — ED Notes (Signed)
ED TO INPATIENT HANDOFF REPORT  Name/Age/Gender Nathaniel Kirk 72 y.o. male  Code Status Code Status History    Date Active Date Inactive Code Status Order ID Comments User Context   09/10/2017 1551 09/11/2017 1613 Full Code [id number]  Conrad Yoe, MD Inpatient   08/31/2017 1846 09/04/2017 1457 Full Code [id number]  Cleon Gustin, MD Inpatient      Home/SNF/Other Home  Chief Complaint low hemaglobin  Level of Care/Admitting Diagnosis ED Disposition    ED Disposition Condition Comment   Transfer to Another Facility  The patient appears reasonably stabilized for transfer considering the current resources, flow, and capabilities available in the ED at this time, and I doubt any other [facility] requiring further screening and/or treatment in the ED prior to transfer is p resent.       Medical History Past Medical History:  Diagnosis Date  . Arthritis    knees  . Bladder tumor    removed 08/31/17  post op bleeding   . Cancer [facility])    Bladder  . 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 surgery scheduled with Dr. Bridgett Larsson 09-10-17  . Pneumonia    walking PNA  2016 or 2017  . UTI (urinary tract infection)     Allergies No Known Allergies  IV Location/Drains/Wounds Patient Lines/Drains/Airways Status   Active Line/Drains/Airways    Name:   Placement date:   Placement time:   Site:   Days:   Implanted Port 12/11/17 Right Chest   12/11/17    1525    Chest   172   Incision (Closed) 09/10/17 Groin Right   09/10/17    1215     264   Incision (Closed) 09/10/17 Groin Left   09/10/17    1215     264          Labs/Imaging Results for orders placed or performed during the hospital encounter of 06/01/18 (from the past 48 hour(s))  Comprehensive metabolic panel     Status: Abnormal   Collection Time: 06/01/18 12:26 PM  Result Value Ref Range   Sodium 131 (L) 135 - 145 mmol/L   Potassium 5.4 (H) 3.5 - 5.1 mmol/L   Chloride 101 98 - 111 mmol/L    CO2 23 22 - 32 mmol/L   Glucose, Bld 149 (H) 70 - 99 mg/dL   BUN 33 (H) 8 - 23 mg/dL   Creatinine, Ser 2.24 (H) 0.61 - 1.24 mg/dL   Calcium 8.9 8.9 - 10.3 mg/dL   Total Protein 6.2 (L) 6.5 - 8.1 g/dL   Albumin 3.5 3.5 - 5.0 g/dL   AST 18 15 - 41 U/L   ALT 23 0 - 44 U/L   Alkaline Phosphatase 51 38 - 126 U/L   Total Bilirubin 0.8 0.3 - 1.2 mg/dL   GFR calc non Af Amer 27 (L) >60 mL/min   GFR calc Af Amer 32 (L) >60 mL/min    Comment: (NOTE) The eGFR has been calculated using the CKD EPI equation. This calculation has not been validated in all clinical situations. eGFR's persistently <60 mL/min signify possible Chronic Kidney Disease.    Anion gap 7 5 - 15    Comment: Performed at [facility], [place] [address] [place], [place] [address]  I-Stat CG4 Lactic Acid, ED     Status: None   Collection Time: 06/01/18 12:26 PM  Result Value Ref Range   Lactic Acid, Venous 1.17 0.5 -  1.9 mmol/L  CBC with Differential     Status: Abnormal   Collection Time: 06/01/18 12:26 PM  Result Value Ref Range   WBC 10.3 4.0 - 10.5 K/uL   RBC 3.02 (L) 4.22 - 5.81 MIL/uL   Hemoglobin 9.8 (L) 13.0 - 17.0 g/dL   HCT 30.9 (L) 39.0 - 52.0 %   MCV 102.3 (H) 80.0 - 100.0 fL   MCH 32.5 26.0 - 34.0 pg   MCHC 31.7 30.0 - 36.0 g/dL   RDW 16.1 (H) 11.5 - 15.5 %   Platelets 239 150 - 400 K/uL   nRBC 0.0 0.0 - 0.2 %   Neutrophils Relative % 82 %   Neutro Abs 8.3 (H) 1.7 - 7.7 K/uL   Lymphocytes Relative 11 %   Lymphs Abs 1.1 0.7 - 4.0 K/uL   Monocytes Relative 7 %   Monocytes Absolute 0.8 0.1 - 1.0 K/uL   Eosinophils Relative 0 %   Eosinophils Absolute 0.0 0.0 - 0.5 K/uL   Basophils Relative 0 %   Basophils Absolute 0.0 0.0 - 0.1 K/uL   Immature Granulocytes 0 %   Abs Immature Granulocytes 0.04 0.00 - 0.07 K/uL    Comment: Performed at [facility], [place] [address], [place], [place] [address]  Protime-INR     Status: None   Collection Time: 06/01/18 12:26 PM  Result  Value Ref Range   Prothrombin Time 13.9 11.4 - 15.2 seconds   INR 1.08     Comment: Performed at [facility], [place] [address] [place], [place] [address]  Urinalysis, Routine w reflex microscopic     Status: Abnormal   Collection Time: 06/01/18 12:26 PM  Result Value Ref Range   Color, Urine YELLOW YELLOW   APPearance TURBID (A) CLEAR   Specific Gravity, Urine >1.030 (H) 1.005 - 1.030   pH 6.0 5.0 - 8.0   Glucose, UA NEGATIVE NEGATIVE mg/dL   Hgb urine dipstick LARGE (A) NEGATIVE   Bilirubin Urine NEGATIVE NEGATIVE   Ketones, ur TRACE (A) NEGATIVE mg/dL   Protein, ur 100 (A) NEGATIVE mg/dL   Nitrite NEGATIVE NEGATIVE   Leukocytes, UA LARGE (A) NEGATIVE    Comment: Performed at [place] [address] [place], [place] [address]  Urinalysis, Microscopic (reflex)     Status: Abnormal   Collection Time: 06/01/18 12:26 PM  Result Value Ref Range   RBC / HPF 21-50 0 - 5 RBC/hpf   WBC, UA >50 0 - 5 WBC/hpf   Bacteria, UA MANY (A) NONE SEEN   Squamous Epithelial / LPF NONE SEEN 0 - 5   WBC Clumps PRESENT    Mucus PRESENT     Comment: Performed at [facility], [place] [address] [place], [place] [address]  CBG monitoring, ED     Status: Abnormal   Collection Time: 06/01/18  1:27 PM  Result Value Ref Range   Glucose-Capillary 122 (H) 70 - 99 mg/dL   Ct Abdomen Pelvis Wo Contrast  Result Date: 06/01/2018 CLINICAL DATA:  72 year old with recent bladder resection. Abdominal pain. EXAM: CT CHEST, ABDOMEN AND PELVIS WITHOUT CONTRAST TECHNIQUE: Multidetector CT imaging of the chest, abdomen and pelvis was performed following the standard protocol without IV contrast. COMPARISON:  10/10/2017 FINDINGS: CT CHEST FINDINGS Cardiovascular: Coronary artery calcifications. Atherosclerotic calcifications in the thoracic aorta without aneurysm. Heart size is normal without significant pericardial fluid. Right jugular Port-A-Cath with the tip in  the lower SVC. Mediastinum/Nodes: No significant mediastinal,  hilar or axillary lymphadenopathy. Lungs/Pleura: Trachea is patent. Endobronchial densities in the left mainstem bronchus probably represent mucus. No large pleural effusions. Emphysematous changes. 1 cm calcified granuloma in the left upper lobe. Scattered punctate nodular densities in both lungs are nonspecific. Index nodule in the posterior left lower lobe on sequence 6, image 92 measuring up to 5 mm. This 5 mm nodule has not significantly changed since 07/12/2017. No significant airspace disease or lung consolidation. Musculoskeletal: No suspicious bone findings. There is a large amount of subcutaneous gas in the right pectoralis and right axillary region extending to the right lateral chest. Small amount of subcutaneous gas in the left lower chest. CT ABDOMEN PELVIS FINDINGS Hepatobiliary: Normal appearance of the liver and gallbladder. Pancreas: Unremarkable. No pancreatic ductal dilatation or surrounding inflammatory changes. Spleen: Normal in size without focal abnormality. Adrenals/Urinary Tract: Normal adrenal glands. Bilateral ureter stents which extend into the ileal conduit. No hydronephrosis. No perinephric edema. Probable cyst in the posterior left kidney which is poorly characterized on this noncontrast examination. The urinary bladder has been removed and there is a large fluid collection at the cystectomy site. Stomach/Bowel: Surgical anastomosis in the rectosigmoid colon region. Surgical changes compatible with creation of an ileal conduit. No evidence for bowel obstruction, dilatation or focal inflammation. Vascular/Lymphatic: Endovascular repair of the abdominal aortic aneurysm and bilateral iliac artery aneurysms. Native aortic aneurysm sac has decreased in size since 10/10/2017 measuring 5.0 cm and previously measured 5.4 cm. Left common iliac artery aneurysm sac measures roughly 5.6 cm and minimally changed. Right common iliac  artery aneurysm sac measures roughly 4.4 cm and stable. Along with the aortic stent graft, there are stents extending into the internal and external iliac arteries bilaterally. Reproductive: Evidence for prostatectomy and cystectomy. Other: Large fluid collection in the pelvis at the cystectomy site. This collection has layering blood levels and findings are most compatible a postoperative hematoma. Hematoma roughly measures 10.7 x 13.0 x 6.4 cm. Large amount of subcutaneous gas in the anterolateral soft tissues of the abdomen and pelvis. No evidence for free intraperitoneal air. Small amount of subcutaneous fluid in the anterior abdomen compatible with postoperative changes. Musculoskeletal: Disc space narrowing with extensive vacuum disc phenomenon at L3-L4 and L4-L5. There is also gas along the posterior aspect of the L4-L5 disc. IMPRESSION: 1. Postoperative changes related to the radical prostatectomy and cystectomy with ileal conduit creation. There is a large dense fluid collection in the pelvis compatible with a postoperative hematoma that measures up to 13 cm. 2. Bilateral ureter stents without hydronephrosis. Stents extend into the ileal conduit. 3. Large amount of subcutaneous gas in the abdomen and pelvis that also extends into the right chest. 4. No acute chest abnormality. Emphysema with a few small pulmonary nodules. Index pulmonary nodule has not significantly changed since 07/12/2017 but these small nodules are indeterminate. Based on the history of bladder cancer, consider surveillance of these small nodules to ensure stability. 5. Endovascular pair of the abdominal and bilateral iliac artery aneurysms. No significant change in the size of the iliac artery aneurysm sacs since 10/10/2017. Decrease in the aortic aneurysm sac size since 10/10/2017. Limited evaluation of vascular structures on this noncontrast examination. 6. Aortic Atherosclerosis (ICD10-I70.0) and Emphysema (ICD10-J43.9).  Electronically Signed   By: Markus Daft M.D.   On: 06/01/2018 14:52   Dg Chest 2 View  Result Date: 06/01/2018 CLINICAL DATA:  Pt had bladder moved 10/21. Pt came home 10/28. Pt c/o of weakness and vomited this morning. Home health  RN reported low BP and surgical drain is draining a lot of blood. Pt also c/o of SOB. EXAM: CHEST - 2 VIEW COMPARISON:  02/10/2016 FINDINGS: Cardiac silhouette normal in size. No mediastinal or hilar masses. No evidence of adenopathy. Clear lungs.  No pleural effusion or pneumothorax. New right anterior chest wall Port-A-Cath, tip projecting in the lower superior vena cava. There is subcutaneous emphysema over the right lateral chest wall. Skeletal structures are demineralized but grossly intact. IMPRESSION: No acute cardiopulmonary disease. Electronically Signed   By: Lajean Manes M.D.   On: 06/01/2018 13:23   Ct Head Wo Contrast  Result Date: 06/01/2018 CLINICAL DATA:  Patient with weakness and dizziness. EXAM: CT HEAD WITHOUT CONTRAST TECHNIQUE: Contiguous axial images were obtained from the base of the skull through the vertex without intravenous contrast. COMPARISON:  None. FINDINGS: Brain: Ventricles and sulci are appropriate for patient's age. No evidence for acute cortically based infarct, intracranial hemorrhage, mass lesion or mass-effect. Vascular: Unremarkable Skull: Intact. Sinuses/Orbits: Paranasal sinuses are well aerated. Mastoid air cells unremarkable. Orbits are unremarkable. Other: None. IMPRESSION: No acute intracranial process. Electronically Signed   By: Lovey Newcomer M.D.   On: 06/01/2018 14:29   Ct Chest Wo Contrast  Result Date: 06/01/2018 CLINICAL DATA:  72 year old with recent bladder resection. Abdominal pain. EXAM: CT CHEST, ABDOMEN AND PELVIS WITHOUT CONTRAST TECHNIQUE: Multidetector CT imaging of the chest, abdomen and pelvis was performed following the standard protocol without IV contrast. COMPARISON:  10/10/2017 FINDINGS: CT CHEST FINDINGS  Cardiovascular: Coronary artery calcifications. Atherosclerotic calcifications in the thoracic aorta without aneurysm. Heart size is normal without significant pericardial fluid. Right jugular Port-A-Cath with the tip in the lower SVC. Mediastinum/Nodes: No significant mediastinal, hilar or axillary lymphadenopathy. Lungs/Pleura: Trachea is patent. Endobronchial densities in the left mainstem bronchus probably represent mucus. No large pleural effusions. Emphysematous changes. 1 cm calcified granuloma in the left upper lobe. Scattered punctate nodular densities in both lungs are nonspecific. Index nodule in the posterior left lower lobe on sequence 6, image 92 measuring up to 5 mm. This 5 mm nodule has not significantly changed since 07/12/2017. No significant airspace disease or lung consolidation. Musculoskeletal: No suspicious bone findings. There is a large amount of subcutaneous gas in the right pectoralis and right axillary region extending to the right lateral chest. Small amount of subcutaneous gas in the left lower chest. CT ABDOMEN PELVIS FINDINGS Hepatobiliary: Normal appearance of the liver and gallbladder. Pancreas: Unremarkable. No pancreatic ductal dilatation or surrounding inflammatory changes. Spleen: Normal in size without focal abnormality. Adrenals/Urinary Tract: Normal adrenal glands. Bilateral ureter stents which extend into the ileal conduit. No hydronephrosis. No perinephric edema. Probable cyst in the posterior left kidney which is poorly characterized on this noncontrast examination. The urinary bladder has been removed and there is a large fluid collection at the cystectomy site. Stomach/Bowel: Surgical anastomosis in the rectosigmoid colon region. Surgical changes compatible with creation of an ileal conduit. No evidence for bowel obstruction, dilatation or focal inflammation. Vascular/Lymphatic: Endovascular repair of the abdominal aortic aneurysm and bilateral iliac artery aneurysms.  Native aortic aneurysm sac has decreased in size since 10/10/2017 measuring 5.0 cm and previously measured 5.4 cm. Left common iliac artery aneurysm sac measures roughly 5.6 cm and minimally changed. Right common iliac artery aneurysm sac measures roughly 4.4 cm and stable. Along with the aortic stent graft, there are stents extending into the internal and external iliac arteries bilaterally. Reproductive: Evidence for prostatectomy and cystectomy. Other: Large fluid collection in the  pelvis at the cystectomy site. This collection has layering blood levels and findings are most compatible a postoperative hematoma. Hematoma roughly measures 10.7 x 13.0 x 6.4 cm. Large amount of subcutaneous gas in the anterolateral soft tissues of the abdomen and pelvis. No evidence for free intraperitoneal air. Small amount of subcutaneous fluid in the anterior abdomen compatible with postoperative changes. Musculoskeletal: Disc space narrowing with extensive vacuum disc phenomenon at L3-L4 and L4-L5. There is also gas along the posterior aspect of the L4-L5 disc. IMPRESSION: 1. Postoperative changes related to the radical prostatectomy and cystectomy with ileal conduit creation. There is a large dense fluid collection in the pelvis compatible with a postoperative hematoma that measures up to 13 cm. 2. Bilateral ureter stents without hydronephrosis. Stents extend into the ileal conduit. 3. Large amount of subcutaneous gas in the abdomen and pelvis that also extends into the right chest. 4. No acute chest abnormality. Emphysema with a few small pulmonary nodules. Index pulmonary nodule has not significantly changed since 07/12/2017 but these small nodules are indeterminate. Based on the history of bladder cancer, consider surveillance of these small nodules to ensure stability. 5. Endovascular pair of the abdominal and bilateral iliac artery aneurysms. No significant change in the size of the iliac artery aneurysm sacs since  10/10/2017. Decrease in the aortic aneurysm sac size since 10/10/2017. Limited evaluation of vascular structures on this noncontrast examination. 6. Aortic Atherosclerosis (ICD10-I70.0) and Emphysema (ICD10-J43.9). Electronically Signed   By: Markus Daft M.D.   On: 06/01/2018 14:52   None  Pending Labs Unresulted Labs (From admission, onward)    Start     Ordered   06/01/18 1220  Culture, blood (Routine x 2)  BLOOD CULTURE X 2,   STAT     06/01/18 1220          Vitals/Pain Today's Vitals   06/01/18 1904 06/01/18 2000 06/01/18 2010 06/01/18 2013  BP: 122/75 128/77  128/72  Pulse: 83 78  77  Resp: (!) '21 20  18  ' SpO2: 100% 100%  100%  Weight:      Height:      PainSc:   10-Worst pain ever     Isolation Precautions No active isolations  Medications Medications  morphine 4 MG/ML injection 4 mg (4 mg Intravenous Given 06/01/18 1409)  HYDROmorphone (DILAUDID) injection 1 mg (1 mg Intravenous Given 06/01/18 1731)  HYDROmorphone (DILAUDID) injection 1 mg (1 mg Intravenous Given 06/01/18 2025)    Mobility walks

## 2018-06-01 NOTE — ED Notes (Signed)
Bed: WA06 Expected date: 06/01/18 Expected time: 11:41 AM Means of arrival: Ambulance Comments: Post op weakness (bladder removal)

## 2018-06-01 NOTE — ED Triage Notes (Signed)
Per EMS: Pt had bladder moved 10/21.  Came home 10/28.  02:20 this am pt c/o of weakness.  Pt vomited this am.  Home health RN got low BP.  Surgical drain Draining a lot of blood.  Pt c/o of SOB.

## 2018-06-01 NOTE — ED Notes (Signed)
[facility] paged over and hour ago and no response, Called back and reqested to please repage urologist.

## 2018-06-01 NOTE — ED Provider Notes (Addendum)
[facility] DEPT Provider Note   CSN: [id number] Arrival date & time: 06/01/18  1143     History   Chief Complaint Chief Complaint  Patient presents with  . Weakness  . Abnormal Lab  . Hypotension    HPI Nathaniel Kirk is a 72 y.o. male.  Patient is a 72 year old male with history of bladder cancer.  He is just over 2 weeks status post cystectomy, prostatectomy, and lymphadenectomy performed robotically at [facility].  He has a urostomy along with an indwelling JP drain to the left abdomen.  Patient presents today with complaints of persistent drainage from the JP drain and clotting to the drain tube itself.  Home health manipulated the tubing today, however were unable to remove the clot.  Patient was brought here to be evaluated for ongoing drainage and clotted tube, however also developed acute onset of dyspnea while home health was tending to his wound.  He states that he felt as though he could not breathe he denies any recent fevers, chills, or productive cough.  He also reports generalized weakness and malaise along with anemia since the surgery.  The history is provided by the patient.  Weakness  Primary symptoms comment: Generalized weakness. This is a new problem. The problem has been rapidly worsening. There has been no fever. Pertinent negatives include no shortness of breath, no chest pain and no vomiting.    Past Medical History:  Diagnosis Date  . Arthritis    knees  . Bladder tumor    removed 08/31/17  post op bleeding   . Cancer [facility])    Bladder  . 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 surgery scheduled with Dr. Bridgett Larsson 09-10-17  . Pneumonia    walking PNA  2016 or 2017  . UTI (urinary tract infection)     Patient Active Problem List   Diagnosis Date Noted  . Port-A-Cath in place 12/12/2017  . Encounter for antineoplastic chemotherapy 12/12/2017  . Bladder cancer ([place]) 11/27/2017  . Bladder  tumor 08/31/2017  . AAA (abdominal aortic aneurysm) without rupture ([place]) 07/27/2017  . 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 ABDOMINAL AORTIC ENDOVASCULAR STENT GRAFT N/A 09/10/2017   Procedure: ABDOMINAL AORTIC ENDOVASCULAR STENT GRAFT WITH RIGHT AND  LEFT ILIAC BRANCH EXTENSIONS;  Surgeon: Conrad Amistad, MD;  Location: [facility];  Service: Vascular;  Laterality: N/A;  . COLON SURGERY     diverticulitis foot of colon out  . CYSTOSCOPY W/ RETROGRADES Bilateral 08/31/2017   Procedure: CYSTOSCOPY WITH RETROGRADE PYELOGRAM;  Surgeon: Cleon Gustin, MD;  Location: [facility];  Service: Urology;  Laterality: Bilateral;  . HERNIA REPAIR     umbilical  . IR FLUORO GUIDE PORT INSERTION RIGHT  12/11/2017  . IR US GUIDE VASC ACCESS RIGHT  12/11/2017  . TONSILLECTOMY     as kid  . TRANSURETHRAL RESECTION OF BLADDER TUMOR N/A 08/31/2017   Procedure: TRANSURETHRAL RESECTION OF BLADDER TUMOR (TURBT);  Surgeon: Cleon Gustin, MD;  Location: [facility];  Service: Urology;  Laterality: N/A;        Home Medications    Prior to Admission medications   Medication Sig Start Date End Date Taking? Authorizing Provider  buPROPion [facility]) 100 MG tablet  11/30/17   [provider]  cetirizine-pseudoephedrine (ZYRTEC-D) 5-120 MG tablet Take 1 tablet by mouth 2 (two) times daily as needed for allergies.  [provider]  enoxaparin (LOVENOX) 120 MG/0.8ML injection Inject 120 mg into the skin every 12 (twelve) hours.    [provider]  lidocaine-prilocaine (EMLA) cream Apply 1 application topically as needed. 11/27/17   Wyatt Portela, MD  naproxen sodium (ALEVE) 220 MG tablet Take 220 mg by mouth 2 (two) times daily as needed (for knee pain/arthritis pain.).     [provider]  prochlorperazine (COMPAZINE) 10 MG tablet Take 1 tablet (10 mg total) by mouth every 6 (six) hours as needed for nausea or vomiting.  02/07/18   Wyatt Portela, MD    Family History No family history on file.  Social History Social History   Tobacco Use  . Smoking status: Current Every Day Smoker    Years: 40.00  . Smokeless tobacco: Never Used  . Tobacco comment: 1-4 cigarettes per day  Substance Use Topics  . Alcohol use: Yes    Comment: special occasions  . Drug use: No     Allergies   Patient has no known allergies.   Review of Systems Review of Systems  Respiratory: Negative for shortness of breath.   Cardiovascular: Negative for chest pain.  Gastrointestinal: Negative for vomiting.  Neurological: Positive for weakness.  All other systems reviewed and are negative.    Physical Exam Updated Vital Signs BP 93/60   Pulse 77   Resp 16   Ht 5\' 9"  (1.753 m)   Wt 105.2 kg   SpO2 100%   BMI 34.26 kg/m   Physical Exam  Constitutional: He is oriented to person, place, and time. He appears well-developed and well-nourished. No distress.  HENT:  Head: Normocephalic and atraumatic.  Mouth/Throat: Oropharynx is clear and moist.  Neck: Normal range of motion. Neck supple.  Cardiovascular: Normal rate and regular rhythm. Exam reveals no friction rub.  No murmur heard. Pulmonary/Chest: Effort normal and breath sounds normal. No respiratory distress. He has no wheezes. He has no rales.  Abdominal: Soft. Bowel sounds are normal. He exhibits no distension. There is no tenderness.  Urostomy site and surgical incision appear to be healing well.  The wound is well approximated and there is no surrounding erythema, warmth, or drainage.  The urostomy site appears well.  There is yellow urine in the bag.  There is a JP drain present in the left lateral lower abdomen.  There is clot throughout the tubing and clots within the JP drain itself.  Musculoskeletal: Normal range of motion. He exhibits no edema.  Neurological: He is alert and oriented to person, place, and time. Coordination normal.  Skin: Skin is  warm and dry. He is not diaphoretic.  Nursing note and vitals reviewed.    ED Treatments / Results  Labs (all labs ordered are listed, but only abnormal results are displayed) Labs Reviewed  CBC WITH DIFFERENTIAL/PLATELET - Abnormal; Notable for the following components:      Result Value   RBC 3.02 (*)    Hemoglobin 9.8 (*)    HCT 30.9 (*)    MCV 102.3 (*)    RDW 16.1 (*)    Neutro Abs 8.3 (*)    All other components within normal limits  CULTURE, BLOOD (ROUTINE X 2)  CULTURE, BLOOD (ROUTINE X 2)  COMPREHENSIVE METABOLIC PANEL  PROTIME-INR  URINALYSIS, ROUTINE W REFLEX MICROSCOPIC  I-STAT CG4 LACTIC ACID, ED    EKG None  Radiology No results found.  Procedures Procedures (including critical care time)  Medications Ordered in ED Medications -  No data to display   Initial Impression / Assessment and Plan / ED Course  I have reviewed the triage vital signs and the nursing notes.  Pertinent labs & imaging results that were available during my care of the patient were reviewed by me and considered in my medical decision making (see chart for details).  Patient presenting with complaints of pelvic pain and increased bleeding around his JP drain.  His JP drain also appears to be occluded.  He is 2 weeks status post cystectomy, prostatectomy, and lymphadenectomy performed at [facility].  He is hemodynamically stable here and does not appear infected.  He has no fever, no white count, however CT scan does show what appears to be a pelvic hematoma in the area of prior surgery.  This finding was discussed with Dr. Coralyn Mark from urology at [facility] who agrees to accept the patient in transfer.  Also, during his emergency department stay, the family called me to bedside over concerns of him appearing confused and having difficulty speaking.  When I arrived to the room, the patient attempted to respond, however speech was somewhat garbled.  He then cleared his throat and speech became more  clear.  A CT scan of his head was performed and was unremarkable.  He now appears to be back to his baseline.  I am uncertain as to what transpired, whether this was related to the discomfort he was experiencing, or much less likely a TIA.  Either way, he appears back to his baseline and this episode lasted only a few moments.  Final Clinical Impressions(s) / ED Diagnoses   Final diagnoses:  None    ED Discharge Orders    None       Veryl Speak, MD 06/01/18 1646    Veryl Speak, MD 06/02/18 [phone]

## 2018-06-04 MED FILL — Hydromorphone HCl Inj 2 MG/ML: INTRAMUSCULAR | Qty: 1 | Status: AC

## 2018-06-06 LAB — CULTURE, BLOOD (ROUTINE X 2)
Culture: NO GROWTH
Special Requests: ADEQUATE

## 2018-06-13 ENCOUNTER — Inpatient Hospital Stay: Payer: Medicare Other

## 2018-06-13 ENCOUNTER — Inpatient Hospital Stay: Payer: Medicare Other | Attending: Oncology

## 2018-06-13 ENCOUNTER — Telehealth: Payer: Self-pay | Admitting: Oncology

## 2018-06-13 ENCOUNTER — Other Ambulatory Visit: Payer: Self-pay | Admitting: *Deleted

## 2018-06-13 ENCOUNTER — Inpatient Hospital Stay (HOSPITAL_BASED_OUTPATIENT_CLINIC_OR_DEPARTMENT_OTHER): Payer: Medicare Other | Admitting: Oncology

## 2018-06-13 VITALS — BP 111/67 | HR 81 | Temp 97.9°F | Resp 18 | Ht 69.0 in | Wt 227.2 lb

## 2018-06-13 DIAGNOSIS — Z95828 Presence of other vascular implants and grafts: Secondary | ICD-10-CM

## 2018-06-13 DIAGNOSIS — C679 Malignant neoplasm of bladder, unspecified: Secondary | ICD-10-CM

## 2018-06-13 DIAGNOSIS — I2699 Other pulmonary embolism without acute cor pulmonale: Secondary | ICD-10-CM

## 2018-06-13 DIAGNOSIS — Z79899 Other long term (current) drug therapy: Secondary | ICD-10-CM | POA: Insufficient documentation

## 2018-06-13 DIAGNOSIS — R5383 Other fatigue: Secondary | ICD-10-CM | POA: Diagnosis not present

## 2018-06-13 DIAGNOSIS — R42 Dizziness and giddiness: Secondary | ICD-10-CM | POA: Diagnosis not present

## 2018-06-13 LAB — CMP (CANCER CENTER ONLY)
ALT: 23 U/L (ref 0–44)
AST: 18 U/L (ref 15–41)
Albumin: 3.1 g/dL — ABNORMAL LOW (ref 3.5–5.0)
Alkaline Phosphatase: 85 U/L (ref 38–126)
Anion gap: 8 (ref 5–15)
BUN: 25 mg/dL — ABNORMAL HIGH (ref 8–23)
CHLORIDE: 105 mmol/L (ref 98–111)
CO2: 22 mmol/L (ref 22–32)
Calcium: 9.9 mg/dL (ref 8.9–10.3)
Creatinine: 2.39 mg/dL — ABNORMAL HIGH (ref 0.61–1.24)
GFR, EST AFRICAN AMERICAN: 29 mL/min — AB (ref >60)
GFR, Estimated: 25 mL/min — ABNORMAL LOW (ref >60)
Glucose, Bld: 103 mg/dL — ABNORMAL HIGH (ref 70–99)
Potassium: 6.2 mmol/L (ref 3.5–5.1)
SODIUM: 135 mmol/L (ref 135–145)
Total Bilirubin: 0.4 mg/dL (ref 0.3–1.2)
Total Protein: 6.8 g/dL (ref 6.5–8.1)

## 2018-06-13 LAB — CBC WITH DIFFERENTIAL (CANCER CENTER ONLY)
ABS IMMATURE GRANULOCYTES: 0.07 10*3/uL (ref 0.00–0.07)
BASOS ABS: 0 10*3/uL (ref 0.0–0.1)
Basophils Relative: 0 %
EOS PCT: 1 %
Eosinophils Absolute: 0.1 10*3/uL (ref 0.0–0.5)
HEMATOCRIT: 36.9 % — AB (ref 39.0–52.0)
HEMOGLOBIN: 11.6 g/dL — AB (ref 13.0–17.0)
IMMATURE GRANULOCYTES: 1 %
LYMPHS ABS: 1.4 10*3/uL (ref 0.7–4.0)
LYMPHS PCT: 11 %
MCH: 29.9 pg (ref 26.0–34.0)
MCHC: 31.4 g/dL (ref 30.0–36.0)
MCV: 95.1 fL (ref 80.0–100.0)
Monocytes Absolute: 0.8 10*3/uL (ref 0.1–1.0)
Monocytes Relative: 6 %
NEUTROS ABS: 10.4 10*3/uL — AB (ref 1.7–7.7)
NRBC: 0 % (ref 0.0–0.2)
Neutrophils Relative %: 81 %
Platelet Count: 218 10*3/uL (ref 150–400)
RBC: 3.88 MIL/uL — ABNORMAL LOW (ref 4.22–5.81)
RDW: 16.7 % — ABNORMAL HIGH (ref 11.5–15.5)
WBC Count: 12.8 10*3/uL — ABNORMAL HIGH (ref 4.0–10.5)

## 2018-06-13 MED ORDER — SODIUM CHLORIDE 0.9% FLUSH
10.0000 mL | INTRAVENOUS | Status: DC | PRN
  Administered 2018-06-13: 10 mL
  Filled 2018-06-13: qty 10

## 2018-06-13 MED ORDER — HEPARIN SOD (PORK) LOCK FLUSH 100 UNIT/ML IV SOLN
500.0000 [IU] | Freq: Once | INTRAVENOUS | Status: AC | PRN
  Administered 2018-06-13: 500 [IU]
  Filled 2018-06-13: qty 5

## 2018-06-13 MED ORDER — KAYEXALATE PO POWD: 15.0000 g | Freq: Two times a day (BID) | ORAL | 0 refills | Status: AC

## 2018-06-13 NOTE — Telephone Encounter (Signed)
Patient declined and avs.  My chart active.

## 2018-06-13 NOTE — Telephone Encounter (Signed)
Spoke with patient's daughter. Per dr Alen Blew, kayexalate called to [facility], for elevated potassium. Patient to take bid for 7 days. Return here 06/19/18 for follow up b-met. [place] to [employer].

## 2018-06-13 NOTE — Progress Notes (Signed)
Hematology and Oncology Follow Up Visit  Nathaniel Kirk [id number] 05-27-1946 72 y.o. 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  He has stage IVa disease with pelvic adenopathy.    Prior Therapy: He is status post TURBT January 2019 and repeated in October 22, 2017.  Pathology showed high-grade urothelial carcinoma with muscle invasion indicating at least T2 disease.  Neoadjuvant chemotherapy utilizing gemcitabine and cisplatin chemotherapy started in Dec 05, 2017.  He completed 4 cycles of therapy in August 2019  Status post radical cystectomy and lymphadenectomy completed on May 13, 2018.  The final pathology showed only tumor in situ without any lymphadenopathy.  Tis N0  Current therapy: Active surveillance.  Interim History: Nathaniel Kirk returns today for repeat evaluation.  Since the last visit, he underwent a radical prostatectomy and lymphadenectomy in October 2019 and has tolerated it well.  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  He did develop a postoperative hematoma required multiple transfusions and hospitalizations in the interim.  He has been taken off Lovenox at this time.  Clinically, he still overall fatigue and periodically dizzy and is able to ambulate only short distances.  He denies any abdominal pain or discomfort.  He denies any hematuria.   He does not report any headaches, blurry vision, syncope or seizures.  Denies any alteration mental status or confusion.  Does not report any fevers, chills or sweats.  Does not report any cough, wheezing or hemoptysis.  Does not report any chest pain, palpitation, orthopnea or leg edema.  He denies any nausea, vomiting or abdominal distention.  Denies any constipation or diarrhea.  Denies any hematochezia or melena.  Does not report any bone pain  or pathological fractures.  Does not report frequency, urgency or hematuria.  Does not report any skin ecchymosis or easy bruising.  Denies any lymphadenopathy or petechiae.  He denies any anxiety or depression.  Remaining review of systems is negative.    Medications: I have reviewed the patient's current medications.  Current Outpatient Medications  Medication Sig Dispense Refill  . acetaminophen (TYLENOL) 325 MG tablet Take 650 mg by mouth every 6 (six) hours as needed.    . enoxaparin (LOVENOX) 120 MG/0.8ML injection Inject 120 mg into the skin every 12 (twelve) hours.    . ferrous sulfate 325 (65 FE) MG tablet Take 325 mg by mouth daily with breakfast.    . lidocaine-prilocaine (EMLA) cream Apply 1 application topically as needed. 30 g 0  . Oxycodone HCl 10 MG TABS Take 10 mg by mouth every 6 (six) hours.    . polyethylene glycol (MIRALAX / GLYCOLAX) packet Take 1 Container by mouth once.     No current facility-administered medications for this visit.    Facility-Administered Medications Ordered in Other Visits  Medication Dose Route Frequency Provider Last Rate Last Dose  . 0.9 %  sodium chloride infusion   Intravenous Once Nathaniel Merle, MD      . sodium chloride flush (NS) 0.9 % injection 10 mL  10 mL Intracatheter PRN Nathaniel Portela, MD   10 mL at 06/13/18 0932     Allergies: No Known Allergies  Past Medical History, Surgical history, Social history, and Family History were reviewed and updated.    Physical Exam:   Blood pressure 111/67, pulse 81, temperature 97.9 F (36.6 C), temperature source Oral, resp.  rate 18, height 5\' 9"  (1.753 m), weight 227 lb 3.2 oz (103.1 kg), SpO2 100 %.    ECOG: 1   General appearance:  Fatigued appearing gentleman without distress today. Head: Atraumatic without abnormalities Oropharynx: Without any thrush or ulcers. Eyes: No scleral icterus. Lymph nodes: No lymphadenopathy noted in the cervical, supraclavicular, or axillary  nodes Heart:regular rate and rhythm, without any murmurs or gallops.   Lung: Clear to auscultation without any rhonchi, wheezes or dullness to percussion. Abdomin: Soft, nontender without any shifting dullness or ascites. Musculoskeletal: No clubbing or cyanosis. Neurological: No motor or sensory deficits. Skin: No rashes or lesions.          Lab Results: Lab Results  Component Value Date   WBC 10.3 06/01/2018   HGB 9.8 (L) 06/01/2018   HCT 30.9 (L) 06/01/2018   MCV 102.3 (H) 06/01/2018   PLT 239 06/01/2018     Chemistry      Component Value Date/Time   NA 131 (L) 06/01/2018 1226   K 5.4 (H) 06/01/2018 1226   CL 101 06/01/2018 1226   CO2 23 06/01/2018 1226   BUN 33 (H) 06/01/2018 1226   CREATININE 2.24 (H) 06/01/2018 1226   CREATININE 1.72 (H) 04/16/2018 1346      Component Value Date/Time   CALCIUM 8.9 06/01/2018 1226   ALKPHOS 51 06/01/2018 1226   AST 18 06/01/2018 1226   AST 19 04/16/2018 1346   ALT 23 06/01/2018 1226   ALT 16 04/16/2018 1346   BILITOT 0.8 06/01/2018 1226   BILITOT 0.2 (L) 04/16/2018 1346       Impression and Plan:   72 year old man with the:  1.    High-grade urothelial carcinoma of the bladder that was found to have stage IVa disease diagnosed in February 2019.  He is status post neoadjuvant chemotherapy followed by radical cystectomy and lymphadenectomy in October 2019.  The natural course of this disease was discussed today and his pathology was reviewed.  He appears to have no residual invasive disease except for multifocal carcinoma in situ.  I recommended continued active surveillance at this time with repeat imaging studies and clinical surveillance.  His imaging surveillance will be done at [facility].  2.  IV access: Port-A-Cath remains in place for the time being and will be flushed every 2 months.  Risks and benefits of removing Port-A-Cath was discussed today and for the time being we will keep it given his tenuous  clinical status.  3.    Pulmonary embolism: Lovenox was discontinued because of postoperative bleeding.  4.  Renal function surveillance: Creatinine increased on 06/01/2018 and will be repeated today.  5.  Follow-up: We will be in 6 months to follow his progress.  15 minutes was spent with the patient face-to-face today.  More than 50% of time was dedicated to discussing his disease status, reviewing pathology results and coordinating plan of care.    Zola Button, MD 11/21/20199:43 AM

## 2018-06-14 ENCOUNTER — Telehealth: Payer: Self-pay | Admitting: Oncology

## 2018-06-14 NOTE — Telephone Encounter (Signed)
Scheduled appt per 11/21 sch message - unable to reach patient or leave message - sent reminder letter in the mail.

## 2018-06-19 ENCOUNTER — Inpatient Hospital Stay: Payer: Medicare Other

## 2018-06-21 ENCOUNTER — Telehealth: Payer: Self-pay | Admitting: Oncology

## 2018-06-21 NOTE — Telephone Encounter (Signed)
Called regarding 12/2 °

## 2018-06-23 DIAGNOSIS — N179 Acute kidney failure, unspecified: Secondary | ICD-10-CM

## 2018-06-23 HISTORY — DX: Acute kidney failure, unspecified: N17.9

## 2018-06-24 ENCOUNTER — Inpatient Hospital Stay: Payer: Medicare Other | Attending: Oncology

## 2018-06-24 DIAGNOSIS — C679 Malignant neoplasm of bladder, unspecified: Secondary | ICD-10-CM | POA: Diagnosis not present

## 2018-06-24 LAB — CBC WITH DIFFERENTIAL (CANCER CENTER ONLY)
Abs Immature Granulocytes: 0.11 10*3/uL — ABNORMAL HIGH (ref 0.00–0.07)
Basophils Absolute: 0 10*3/uL (ref 0.0–0.1)
Basophils Relative: 0 %
EOS ABS: 0 10*3/uL (ref 0.0–0.5)
EOS PCT: 0 %
HEMATOCRIT: 39.9 % (ref 39.0–52.0)
HEMOGLOBIN: 12.6 g/dL — AB (ref 13.0–17.0)
Immature Granulocytes: 1 %
Lymphocytes Relative: 11 %
Lymphs Abs: 2 10*3/uL (ref 0.7–4.0)
MCH: 30 pg (ref 26.0–34.0)
MCHC: 31.6 g/dL (ref 30.0–36.0)
MCV: 95 fL (ref 80.0–100.0)
MONOS PCT: 6 %
Monocytes Absolute: 1.1 10*3/uL — ABNORMAL HIGH (ref 0.1–1.0)
Neutro Abs: 14 10*3/uL — ABNORMAL HIGH (ref 1.7–7.7)
Neutrophils Relative %: 82 %
Platelet Count: 235 10*3/uL (ref 150–400)
RBC: 4.2 MIL/uL — AB (ref 4.22–5.81)
RDW: 16.4 % — AB (ref 11.5–15.5)
WBC Count: 17.2 10*3/uL — ABNORMAL HIGH (ref 4.0–10.5)
nRBC: 0 % (ref 0.0–0.2)

## 2018-06-24 LAB — CMP (CANCER CENTER ONLY)
ALBUMIN: 3.2 g/dL — AB (ref 3.5–5.0)
ALK PHOS: 94 U/L (ref 38–126)
ALT: 14 U/L (ref 0–44)
AST: 18 U/L (ref 15–41)
Anion gap: 8 (ref 5–15)
BILIRUBIN TOTAL: 0.3 mg/dL (ref 0.3–1.2)
BUN: 33 mg/dL — AB (ref 8–23)
CALCIUM: 10.2 mg/dL (ref 8.9–10.3)
CO2: 24 mmol/L (ref 22–32)
Chloride: 104 mmol/L (ref 98–111)
Creatinine: 2.26 mg/dL — ABNORMAL HIGH (ref 0.61–1.24)
GFR, Est AFR Am: 32 mL/min — ABNORMAL LOW (ref >60)
GFR, Estimated: 28 mL/min — ABNORMAL LOW (ref >60)
GLUCOSE: 131 mg/dL — AB (ref 70–99)
Potassium: 5.1 mmol/L (ref 3.5–5.1)
Sodium: 136 mmol/L (ref 135–145)
TOTAL PROTEIN: 7.5 g/dL (ref 6.5–8.1)

## 2018-06-25 ENCOUNTER — Telehealth: Payer: Self-pay | Admitting: *Deleted

## 2018-06-25 NOTE — Telephone Encounter (Signed)
-----   Message from Wyatt Portela, MD sent at 06/25/2018  8:46 AM EST ----- Please let him know his labs look good. His K is back to normal.

## 2018-06-25 NOTE — Telephone Encounter (Signed)
Spoke with patient, per dr Alen Blew, his labs are normal and potassium is back to normal.

## 2018-07-10 ENCOUNTER — Encounter (HOSPITAL_COMMUNITY): Payer: Self-pay

## 2018-07-10 ENCOUNTER — Emergency Department (HOSPITAL_COMMUNITY): Payer: Medicare Other

## 2018-07-10 ENCOUNTER — Inpatient Hospital Stay (HOSPITAL_COMMUNITY)
Admission: EM | Admit: 2018-07-10 | Discharge: 2018-07-15 | DRG: 690 | Disposition: A | Discharge status: Home Health | Payer: Medicare Other | Attending: Internal Medicine | Admitting: Internal Medicine

## 2018-07-10 DIAGNOSIS — C679 Malignant neoplasm of bladder, unspecified: Secondary | ICD-10-CM | POA: Diagnosis not present

## 2018-07-10 DIAGNOSIS — R0989 Other specified symptoms and signs involving the circulatory and respiratory systems: Secondary | ICD-10-CM | POA: Diagnosis present

## 2018-07-10 DIAGNOSIS — R0600 Dyspnea, unspecified: Secondary | ICD-10-CM | POA: Diagnosis present

## 2018-07-10 DIAGNOSIS — E875 Hyperkalemia: Secondary | ICD-10-CM | POA: Diagnosis not present

## 2018-07-10 DIAGNOSIS — I739 Peripheral vascular disease, unspecified: Secondary | ICD-10-CM | POA: Diagnosis present

## 2018-07-10 DIAGNOSIS — E669 Obesity, unspecified: Secondary | ICD-10-CM | POA: Diagnosis present

## 2018-07-10 DIAGNOSIS — R0602 Shortness of breath: Secondary | ICD-10-CM | POA: Diagnosis not present

## 2018-07-10 DIAGNOSIS — Z6832 Body mass index (BMI) 32.0-32.9, adult: Secondary | ICD-10-CM

## 2018-07-10 DIAGNOSIS — R739 Hyperglycemia, unspecified: Secondary | ICD-10-CM | POA: Diagnosis not present

## 2018-07-10 DIAGNOSIS — I2781 Cor pulmonale (chronic): Secondary | ICD-10-CM | POA: Diagnosis present

## 2018-07-10 DIAGNOSIS — B961 Klebsiella pneumoniae [K. pneumoniae] as the cause of diseases classified elsewhere: Secondary | ICD-10-CM | POA: Diagnosis present

## 2018-07-10 DIAGNOSIS — F17210 Nicotine dependence, cigarettes, uncomplicated: Secondary | ICD-10-CM | POA: Diagnosis present

## 2018-07-10 DIAGNOSIS — Z8551 Personal history of malignant neoplasm of bladder: Secondary | ICD-10-CM

## 2018-07-10 DIAGNOSIS — E861 Hypovolemia: Secondary | ICD-10-CM | POA: Diagnosis present

## 2018-07-10 DIAGNOSIS — B952 Enterococcus as the cause of diseases classified elsewhere: Secondary | ICD-10-CM | POA: Diagnosis present

## 2018-07-10 DIAGNOSIS — N179 Acute kidney failure, unspecified: Secondary | ICD-10-CM | POA: Diagnosis not present

## 2018-07-10 DIAGNOSIS — E871 Hypo-osmolality and hyponatremia: Secondary | ICD-10-CM | POA: Diagnosis present

## 2018-07-10 DIAGNOSIS — N184 Chronic kidney disease, stage 4 (severe): Secondary | ICD-10-CM | POA: Diagnosis present

## 2018-07-10 DIAGNOSIS — I5021 Acute systolic (congestive) heart failure: Secondary | ICD-10-CM | POA: Diagnosis present

## 2018-07-10 DIAGNOSIS — N390 Urinary tract infection, site not specified: Principal | ICD-10-CM | POA: Diagnosis present

## 2018-07-10 DIAGNOSIS — I5020 Unspecified systolic (congestive) heart failure: Secondary | ICD-10-CM | POA: Diagnosis present

## 2018-07-10 DIAGNOSIS — I714 Abdominal aortic aneurysm, without rupture: Secondary | ICD-10-CM | POA: Diagnosis present

## 2018-07-10 DIAGNOSIS — J441 Chronic obstructive pulmonary disease with (acute) exacerbation: Secondary | ICD-10-CM | POA: Diagnosis present

## 2018-07-10 DIAGNOSIS — R630 Anorexia: Secondary | ICD-10-CM | POA: Diagnosis present

## 2018-07-10 DIAGNOSIS — Z936 Other artificial openings of urinary tract status: Secondary | ICD-10-CM

## 2018-07-10 DIAGNOSIS — I493 Ventricular premature depolarization: Secondary | ICD-10-CM | POA: Diagnosis present

## 2018-07-10 DIAGNOSIS — Z79899 Other long term (current) drug therapy: Secondary | ICD-10-CM

## 2018-07-10 DIAGNOSIS — Z95828 Presence of other vascular implants and grafts: Secondary | ICD-10-CM

## 2018-07-10 DIAGNOSIS — Z9221 Personal history of antineoplastic chemotherapy: Secondary | ICD-10-CM

## 2018-07-10 DIAGNOSIS — I429 Cardiomyopathy, unspecified: Secondary | ICD-10-CM | POA: Diagnosis present

## 2018-07-10 DIAGNOSIS — I959 Hypotension, unspecified: Secondary | ICD-10-CM | POA: Diagnosis present

## 2018-07-10 DIAGNOSIS — J449 Chronic obstructive pulmonary disease, unspecified: Secondary | ICD-10-CM | POA: Diagnosis present

## 2018-07-10 DIAGNOSIS — I9589 Other hypotension: Secondary | ICD-10-CM

## 2018-07-10 DIAGNOSIS — Z86711 Personal history of pulmonary embolism: Secondary | ICD-10-CM

## 2018-07-10 HISTORY — DX: Acute kidney failure, unspecified: N17.9

## 2018-07-10 LAB — CBC WITH DIFFERENTIAL/PLATELET
ABS IMMATURE GRANULOCYTES: 0.2 10*3/uL — AB (ref 0.00–0.07)
BAND NEUTROPHILS: 1 %
BASOS ABS: 0 10*3/uL (ref 0.0–0.1)
BASOS PCT: 0 %
EOS PCT: 0 %
Eosinophils Absolute: 0 10*3/uL (ref 0.0–0.5)
HCT: 36.8 % — ABNORMAL LOW (ref 39.0–52.0)
Hemoglobin: 11.9 g/dL — ABNORMAL LOW (ref 13.0–17.0)
LYMPHS ABS: 0.8 10*3/uL (ref 0.7–4.0)
LYMPHS PCT: 4 %
MCH: 30.5 pg (ref 26.0–34.0)
MCHC: 32.3 g/dL (ref 30.0–36.0)
MCV: 94.4 fL (ref 80.0–100.0)
METAMYELOCYTES PCT: 1 %
Monocytes Absolute: 1.2 10*3/uL — ABNORMAL HIGH (ref 0.1–1.0)
Monocytes Relative: 6 %
NEUTROS PCT: 88 %
NRBC: 0 % (ref 0.0–0.2)
Neutro Abs: 18 10*3/uL — ABNORMAL HIGH (ref 1.7–7.7)
PLATELETS: 184 10*3/uL (ref 150–400)
RBC: 3.9 MIL/uL — ABNORMAL LOW (ref 4.22–5.81)
RDW: 17 % — AB (ref 11.5–15.5)
WBC: 20.2 10*3/uL — ABNORMAL HIGH (ref 4.0–10.5)
nRBC: 0 /100 WBC

## 2018-07-10 LAB — COMPREHENSIVE METABOLIC PANEL
ALBUMIN: 3.1 g/dL — AB (ref 3.5–5.0)
ALK PHOS: 135 U/L — AB (ref 38–126)
ALT: 34 U/L (ref 0–44)
AST: 30 U/L (ref 15–41)
Anion gap: 13 (ref 5–15)
BUN: 54 mg/dL — ABNORMAL HIGH (ref 8–23)
CALCIUM: 9.4 mg/dL (ref 8.9–10.3)
CHLORIDE: 94 mmol/L — AB (ref 98–111)
CO2: 21 mmol/L — AB (ref 22–32)
CREATININE: 2.85 mg/dL — AB (ref 0.61–1.24)
GFR calc Af Amer: 24 mL/min — ABNORMAL LOW (ref >60)
GFR calc non Af Amer: 21 mL/min — ABNORMAL LOW (ref >60)
GLUCOSE: 225 mg/dL — AB (ref 70–99)
Potassium: 6.1 mmol/L — ABNORMAL HIGH (ref 3.5–5.1)
SODIUM: 128 mmol/L — AB (ref 135–145)
Total Bilirubin: 0.6 mg/dL (ref 0.3–1.2)
Total Protein: 7.1 g/dL (ref 6.5–8.1)

## 2018-07-10 LAB — I-STAT TROPONIN, ED: Troponin i, poc: 0.01 ng/mL (ref 0.00–0.08)

## 2018-07-10 MED ORDER — DEXTROSE 50 % IV SOLN
1.0000 | Freq: Once | INTRAVENOUS | Status: AC
  Administered 2018-07-11: 50 mL via INTRAVENOUS
  Filled 2018-07-10: qty 50

## 2018-07-10 MED ORDER — CALCIUM GLUCONATE-NACL 1-0.675 GM/50ML-% IV SOLN
1.0000 g | Freq: Once | INTRAVENOUS | Status: AC
  Administered 2018-07-11: 1000 mg via INTRAVENOUS
  Filled 2018-07-10: qty 50

## 2018-07-10 MED ORDER — INSULIN ASPART 100 UNIT/ML IV SOLN
10.0000 [IU] | Freq: Once | INTRAVENOUS | Status: AC
  Administered 2018-07-11: 10 [IU] via INTRAVENOUS

## 2018-07-10 MED ORDER — SODIUM CHLORIDE 0.9 % IV BOLUS
1000.0000 mL | Freq: Once | INTRAVENOUS | Status: AC
  Administered 2018-07-10: 1000 mL via INTRAVENOUS

## 2018-07-10 MED ORDER — SODIUM CHLORIDE 0.9 % IV SOLN
1.0000 g | Freq: Once | INTRAVENOUS | Status: DC
  Filled 2018-07-10 (×2): qty 10

## 2018-07-10 MED ORDER — IOPAMIDOL (ISOVUE-370) INJECTION 76%
INTRAVENOUS | Status: AC
  Filled 2018-07-10: qty 100

## 2018-07-10 NOTE — H&P (Signed)
Nathaniel Kirk QIH:[id number] DOB: 1946/01/26 DOA: 07/10/2018     PCP: Willey Blade, MD   Outpatient Specialists:      Oncology [facility] Urology Dr. Ennis Forts at [facility]  Patient arrived to ER on  at   Patient coming from: home Lives With family    Chief Complaint:  Chief Complaint  Patient presents with  . Shortness of Breath    HPI: Nathaniel Kirk is a 72 y.o. 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 He was doing a bit wheezing few weeks ago but now resolved EMS could not read it. No Chest pain. He has a history of PEs was actually taken off Lovenox after he had an abdominal hematoma to me in October  Regarding pertinent Chronic problems: no hx of heart disease  History of bladder cancer status post cystectomy with ileal conduit. post operative course complicated by postoperative hypotension and anemia requiring blood transfusion. developed an AKI with creatinine peak of 4.2 before downtrending to 1.6 on discharge is able to be discharged home on therapeutic Lovenox in end of October and history of bilateral PEs The first week of November patient noted increased bloody discharge around his JP site his hemoglobin dropped to 10 she was noted to be hypotensive 80/50 CT chest and abdomen revealed 12 cm pelvic hematoma he developed Aki with peak creatinine 3.4 but this downtrended to 1.3 on discharge.    Urine cultures from admission grew klebsiella and he was discharged on 7d bactrim Ureteral stents were removed prior to discharge. Regarding his PE/DVT, given that he was not having pulmonary symptoms and his history of bleed on anticoagulation, the decision was made to discontinue anticoagulation. He was discharged off anticoagulation, he does have an IVC filter.  Since then his hemoglobin has been stable   While in ER: Gave him a liter of fluids his heart rates in the 80s his blood pressure still went up to 110's  K 6.1 was treated with calcium gluconate insulin and dextrose as well as 1500 mL of fluid  unfortunately he has an AKI and is not able to get contrast for CTA but I did call in the nuclear medicine tech and she is going to come and do a VQ scan tonight  The following Work up has been ordered so far:  Orders Placed This Encounter  Procedures  . DG Chest 2 View  . NM PULMONARY VENT AND PERF (V/Q Scan)  . Comprehensive metabolic panel  . CBC with Differential  . Measure blood pressure  . Place Patient on a Cardiac Monitor  . Initiate Carrier Fluid Protocol  . Consult to hospitalist  . I-stat troponin, ED  . ED EKG  . EKG 12-Lead     Following Medications were ordered in ER: Medications  iopamidol (ISOVUE-370) 76 % injection (has no administration in time range)  insulin aspart (novoLOG) injection 10 Units (has no administration in time range)  And  dextrose 50 % solution 50 mL (has no administration in time range)  calcium gluconate 1 g/ 50 mL sodium chloride IVPB (has no administration in time range)  sodium chloride 0.9 % bolus 1,000 mL (1,000 mLs Intravenous New Bag/Given 07/10/18 2216)    Significant initial  Findings: Abnormal Labs Reviewed  COMPREHENSIVE METABOLIC PANEL - Abnormal; Notable for the following components:      Result Value   Sodium 128 (*)    Potassium 6.1 (*)    Chloride 94 (*)    CO2 21 (*)    Glucose, Bld 225 (*)    BUN 54 (*)     Creatinine, Ser 2.85 (*)    Albumin 3.1 (*)    Alkaline Phosphatase 135 (*)    GFR calc non Af Amer 21 (*)    GFR calc Af Amer 24 (*)    All other components within normal limits  CBC WITH DIFFERENTIAL/PLATELET - Abnormal; Notable for the following components:   WBC 20.2 (*)    RBC 3.90 (*)    Hemoglobin 11.9 (*)    HCT 36.8 (*)    RDW 17.0 (*)    Neutro Abs 18.0 (*)    Monocytes Absolute 1.2 (*)    Abs Immature Granulocytes 0.20 (*)    All other components within normal limits     Lactic Acid, Venous    Component Value Date/Time   LATICACIDVEN 1.17 06/01/2018 1226      Cr     Up from baseline see below Lab Results  Component Value Date   CREATININE 2.85 (H) 07/10/2018   CREATININE 2.26 (H) 06/24/2018   CREATININE 2.39 (H) 06/13/2018        HG/HCT stable,       Component Value Date/Time   HGB 11.9 (L) 07/10/2018 2204   HGB 12.6 (L) 06/24/2018 1127   HCT 36.8 (L) 07/10/2018 2204     Troponin (Point of Care Test) Recent Labs    07/10/18 2205  TROPIPOC 0.01       UA  ordered    CXR -  NON acute but showing Cardiac enlargement. Emphysematous and chronic bronchitic changes in the lungs    ECG:  Personally reviewed by me showing: HR : 96 Rhythm:   Sinus tachycardia    no evidence of ischemic changes QTC 416     ED Triage Vitals  Enc Vitals Group     BP 07/10/18 2124 (!) 87/86     Pulse Rate 07/10/18 2124 (!) 109     Resp 07/10/18 2124 19     Temp 07/10/18 2124 97.8 F (36.6 C)     Temp Source 07/10/18 2124 Oral     SpO2 07/10/18 2124 99 %     Weight 07/10/18 2125 218 lb (98.9 kg)     Height 07/10/18 2125 5\' 9"  (1.753 m)     Head Circumference --      Peak Flow --      Pain Score 07/10/18 2124 0     Pain Loc --      Pain Edu? --      Excl. in [place]? --   TMAX(24)@       Latest  Blood pressure 113/76, pulse 86, temperature 97.8 F (36.6 C), temperature source Oral, resp. rate 13, height 5\' 9"  (1.753 m), weight 98.9 kg, SpO2 99 %.      Hospitalist was called for admission for sepsis hyponatremia dyspnea  Review of Systems:    Pertinent  positives include: fatigue,  shortness of breath at rest weight loss   dyspnea on exertion non-productive cough,  Constitutional:    night sweats, Fevers, chills,  HEENT:  No headaches, Difficulty swallowing,Tooth/dental problems, Sore throat,  No sneezing, itching, ear ache, nasal congestion, post nasal drip,  Cardio-vascular:  No chest pain, Orthopnea, PND, anasarca, dizziness, palpitations.no Bilateral lower extremity swelling  GI:  No heartburn, indigestion, abdominal pain, nausea, vomiting, diarrhea, change in bowel habits, loss of appetite, melena, blood in stool, hematemesis Resp:    No excess mucus, no productive cough,   No coughing up of blood. No change in color of mucus.  Skin:  no rash or lesions. No jaundice GU:  no dysuria, change in color of urine, no urgency or frequency. No straining to urinate.  No flank pain.  Musculoskeletal:  No joint pain or no joint swelling. No decreased range of motion. No back pain.  Psych:  No change in mood or affect. No depression or anxiety. No memory loss.  Neuro: no localizing neurological complaints, no tingling, no weakness, no double vision, no gait abnormality, no slurred speech, no confusion  All systems reviewed and apart from [place] all are negative  Past Medical History:   Past Medical History:  Diagnosis Date  . Arthritis    knees  . Bladder tumor    removed 08/31/17  post op bleeding   . Cancer [facility])    Bladder  . 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 surgery scheduled with Dr. Bridgett Larsson 09-10-17  . Pneumonia    walking PNA  2016 or 2017  . UTI (urinary tract infection)      Past Surgical History:  Procedure Laterality Date  . ABDOMINAL AORTIC ENDOVASCULAR STENT GRAFT N/A 09/10/2017   Procedure: ABDOMINAL AORTIC ENDOVASCULAR STENT GRAFT WITH RIGHT AND  LEFT ILIAC BRANCH  EXTENSIONS;  Surgeon: Conrad Hanover, MD;  Location: [facility];  Service: Vascular;  Laterality: N/A;  . COLON SURGERY     diverticulitis foot of colon out  . CYSTOSCOPY W/ RETROGRADES Bilateral 08/31/2017   Procedure: CYSTOSCOPY WITH RETROGRADE PYELOGRAM;  Surgeon: Cleon Gustin, MD;  Location: [facility];  Service: Urology;  Laterality: Bilateral;  . HERNIA REPAIR     umbilical  . IR FLUORO GUIDE PORT INSERTION RIGHT  12/11/2017  . IR US GUIDE VASC ACCESS RIGHT  12/11/2017  . TONSILLECTOMY     as kid  . TRANSURETHRAL RESECTION OF BLADDER TUMOR N/A 08/31/2017   Procedure: TRANSURETHRAL RESECTION OF BLADDER TUMOR (TURBT);  Surgeon: Cleon Gustin, MD;  Location: [facility];  Service: Urology;  Laterality: N/A;    Social History:  Ambulatory   independently       reports that he has been smoking. He has smoked for the past 40.00 years. He has never used smokeless tobacco. He reports current alcohol use. He reports that he does not use drugs.     Family History:   Family History  Problem Relation Age of Onset  . Prostate cancer Father   . CAD Neg Hx   . Diabetes Neg Hx     Allergies: No Known Allergies   Prior to Admission medications   Medication Sig Start Date End Date Taking? Authorizing Provider  acetaminophen (TYLENOL) 325 MG tablet Take 650 mg by mouth every 6 (six) hours as needed.   Yes [provider]  FeFum-FePoly-FA-B Cmp-C-Biot (FOLIVANE-PLUS) CAPS Take by mouth daily. 05/30/18  Yes [provider]  ferrous sulfate 325 (65 FE) MG tablet Take 325 mg by mouth daily with breakfast.   Yes [provider]  ondansetron (ZOFRAN) 4 MG tablet Take 2 tablets by mouth 2 (two) times daily.    Yes [provider]  Oxycodone HCl 10 MG TABS Take 10 mg by mouth every 6 (six) hours.   Yes [provider]  polyethylene glycol (MIRALAX / GLYCOLAX) packet Take 1 Container by mouth once.   Yes [provider]  senna (SENOKOT) 8.6 MG  tablet Take 1 tablet by mouth daily.    Yes [provider]  traMADol (ULTRAM) 50 MG tablet Take 50 mg by mouth every 6 (six) hours as needed for moderate pain.   Yes [provider]  lidocaine-prilocaine (EMLA) cream Apply 1 application topically as needed. Patient not taking: Reported on 07/10/2018 11/27/17   Wyatt Portela, MD   Physical Exam: Blood pressure 113/76, pulse 86, temperature 97.8 F (36.6 C), temperature source Oral, resp. rate 13, height 5\' 9"  (1.753 m), weight 98.9 kg, SpO2 99 %. 1. General:  in No Acute distress  Chronically ill -appearing 2. Psychological: Alert and   Oriented 3. Head/ENT:  Dry Mucous Membranes                          Head Non traumatic, neck supple                           Poor Dentition 4. SKIN:  decreased Skin turgor,  Skin clean Dry and intact no rash 5. Heart: Regular rate and rhythm no Murmur, no Rub or gallop 6. Lungs:   no wheezes SOME  Crackles distant    Breath sounds 7. Abdomen: Soft,  non-tender, Non distended   Obese bowel sounds present 8. Lower extremities: no clubbing, cyanosis, or  edema 9. Neurologically Grossly intact, moving all 4 extremities equally   10. MSK: Normal range of motion   LABS:     Recent Labs  Lab 07/10/18 2204  WBC 20.2*  NEUTROABS 18.0*  HGB 11.9*  HCT 36.8*  MCV 94.4  PLT 361   Basic Metabolic Panel: Recent Labs  Lab 07/10/18 2204  NA 128*  K 6.1*  CL 94*  CO2 21*  GLUCOSE 225*  BUN 54*  CREATININE 2.85*  CALCIUM 9.4      Recent Labs  Lab 07/10/18 2204  AST 30  ALT 34  ALKPHOS 135*  BILITOT 0.6  PROT 7.1  ALBUMIN 3.1*   No results for input(s): LIPASE, AMYLASE in the last 168 hours. No results for input(s): AMMONIA in the last 168 hours.    HbA1C: No results for input(s): HGBA1C in the last 72 hours. CBG: No results for input(s): GLUCAP in the last 168 hours.    Urine analysis:  Cultures:    Component Value Date/Time   SDES BLOOD PORTA CATH  06/01/2018 1226   SPECREQUEST  06/01/2018 1226    BOTTLES DRAWN AEROBIC AND ANAEROBIC Blood Culture adequate volume   CULT  06/01/2018 1226    NO GROWTH 5 DAYS Performed at [facility], [place] [address], [place], [place] [address]    REPTSTATUS 06/06/2018 FINAL 06/01/2018 1226     Radiological Exams on Admission: Dg Chest 2 View  Result Date: 07/10/2018 CLINICAL DATA:  Shortness of breath tonight. EXAM: CHEST - 2 VIEW COMPARISON:  06/01/2018 FINDINGS: Shallow inspiration. Mild cardiac enlargement. No  vascular congestion or edema. Probable emphysematous changes in the lungs. Bronchial wall thickening suggesting chronic bronchitis. No airspace disease or consolidation. No blunting of costophrenic angles. No pneumothorax. Power port type central venous catheter with tip over the cavoatrial junction region. Degenerative changes in the spine. Vascular graft in the abdomen. IMPRESSION: Cardiac enlargement. Emphysematous and chronic bronchitic changes in the lungs. No evidence of active pulmonary disease. Electronically Signed   By: Lucienne Capers M.D.   On: 07/10/2018 23:37    Chart has been reviewed    Assessment/Plan  72 y.o. 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  Currently not on anticoagulation hemoglobin has been stable.  Appears to have dyspnea with exertion.  Given acute on chronic renal disease not a candidate for CTA VQ scan has been ordered.  If VQ scan high probability given hypotension would need pulmonology consult and  code PE activated.  Admit to stepdown evaluate for other source of dyspnea.  Patient with known history of tobacco abuse in the past.  CT scan evidence of emphysema.  . Hyponatremia -in the setting what appears to be dehydration.  Obtain urine electrolytes  rehydrate and follow . Hyperkalemia has been treated emergency department will repeat for treat as needed . Hyperglycemia no prior history of diabetes although have had elevated blood sugars in the past will order sliding scale check hemoglobin A1c diabetic education . Bladder cancer [facility]) -followed by urology at [facility] will resume care after admission . AKI (acute kidney injury) ([place]) -rehydrate patient have had decreased p.o. intake . Hypotension -rehydrate full fluid status lactic acid within normal limits Copd -may be contributing to shortness of breath make sure patient is on Xopenex and Atrovent currently no wheezing  Other plan as per orders.  DVT prophylaxis:  SCD    Code Status:  FULL CODE as per patient  I had personally discussed CODE STATUS with patient and family   Family Communication:   Family  at  Bedside  plan of care was discussed with   Son, Daughters,   Disposition Plan:      To home once workup is complete and patient is stable                   Would benefit from PT/OT eval prior to [place] called: none   Admission status:    Obs    Level of care   tele 24H             Sheanna Dail 07/11/2018, 2:28 AM    [facility]  Pager [phone]   after 2 AM please page floor coverage PA If 7AM-7PM, please contact the day team taking care of the patient  [email or web address]  Password [email or web address]

## 2018-07-10 NOTE — ED Provider Notes (Signed)
Complains of shortness of breath gradual in onset 2 days ago becoming suddenly worse today.  Dyspnea is worse with exertion.  He admits to minimal cough.  Denies fever denies chest pain or pain anywhere.  No other associated symptoms.  On exam no distress, speaks in paragraphs.  Lungs clear to auscultation heart regular rate and rhythm extremities without edema   Orlie Dakin, MD 07/10/18 2221

## 2018-07-10 NOTE — ED Notes (Signed)
Called Nuc Med Tech Shannin [place]  Ordered [place] Scan per Dr Amalia Greenhouse Submitted by PA Ward  Tech advised it would be a few hours due to ordering a [place]

## 2018-07-10 NOTE — ED Triage Notes (Signed)
Patient BIB [facility] for increased SOB today. Patient also c/o weakness and cough. Denies chest pain, fever, or abdominal pain. Patient reports recent bladder surgery in Oct at [facility]. Hx. Bilateral PE and Cancer. States his home health nurse has told him his BP has been on the lower side the last few days. B  BP 80/50 HR 110 RR 20-30  CBG 220

## 2018-07-10 NOTE — ED Provider Notes (Signed)
[place] EMERGENCY DEPARTMENT Provider Note   CSN: [id number] Arrival date & time:        History   Chief Complaint Chief Complaint  Patient presents with  . Shortness of Breath    HPI Nathaniel Kirk is a 72 y.o. male.  The history is provided by the patient and medical records. No language interpreter was used.  Shortness of Breath  Pertinent negatives include no chest pain and no leg swelling.   Nathaniel Kirk is a 72 y.o. male  who presents to the Emergency Department complaining of shortness of breath x 1 week which acutely worsened today.  Denies any chest pain.  He did have a painful knot to the left calf a few days ago, but states this has resolved and he is no longer having any calf pain.  Never had any lower extremity swelling.  Gets very short of breath just with talking. Dyspnea worse with exertion. No medications or treatments prior to arrival for symptoms. No infectious symptoms recently.   Patient has a hx of high-grade urothelial carcinoma of the bladder which was diagnosed in February 2018.  He is status post chemo and radical cystectomy and lymphadenectomy which was in October 2019.  He has a history of bilateral PE's and was on Lovenox, however has had postoperative bleeding leading to abdominal hematoma, therefore Lovenox was discontinued.  Per family at bedside, patient did have a filter placed since surgery in October however I cannot find this in chart review.   Past Medical History:  Diagnosis Date  . Arthritis    knees  . Bladder tumor    removed 08/31/17  post op bleeding   . Cancer [facility])    Bladder  . 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 surgery scheduled with Dr. Bridgett Larsson 09-10-17  . Pneumonia    walking PNA  2016 or 2017  . UTI (urinary tract infection)     Patient Active Problem List   Diagnosis Date Noted  . Port-A-Cath in place 12/12/2017  . Encounter for antineoplastic chemotherapy  12/12/2017  . Bladder cancer ([place]) 11/27/2017  . Bladder tumor 08/31/2017  . AAA (abdominal aortic aneurysm) without rupture ([place]) 07/27/2017  . 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 ABDOMINAL AORTIC ENDOVASCULAR STENT GRAFT N/A 09/10/2017   Procedure: ABDOMINAL AORTIC ENDOVASCULAR STENT GRAFT WITH RIGHT AND  LEFT ILIAC BRANCH EXTENSIONS;  Surgeon: Conrad [place], MD;  Location: [place];  Service: Vascular;  Laterality: N/A;  . COLON SURGERY     diverticulitis foot of colon out  . CYSTOSCOPY W/ RETROGRADES Bilateral 08/31/2017   Procedure: CYSTOSCOPY WITH RETROGRADE PYELOGRAM;  Surgeon: Cleon Gustin, MD;  [facility];  Service: Urology;  Laterality: Bilateral;  . HERNIA REPAIR     umbilical  . IR FLUORO GUIDE PORT INSERTION RIGHT  12/11/2017  . IR US GUIDE VASC ACCESS RIGHT  12/11/2017  . TONSILLECTOMY     as kid  . TRANSURETHRAL RESECTION OF BLADDER TUMOR N/A 08/31/2017   Procedure: TRANSURETHRAL RESECTION OF BLADDER TUMOR (TURBT);  Surgeon: Cleon Gustin, MD;  [facility];  Service: Urology;  Laterality: N/A;        Home Medications    Prior to Admission medications   Medication Sig Start Date End Date Taking? Authorizing Provider  acetaminophen (TYLENOL) 325 MG tablet Take 650 mg by mouth every 6 (six) hours  as needed.   Yes [provider]  FeFum-FePoly-FA-B Cmp-C-Biot (FOLIVANE-PLUS) CAPS Take by mouth daily. 05/30/18  Yes [provider]  ferrous sulfate 325 (65 FE) MG tablet Take 325 mg by mouth daily with breakfast.   Yes [provider]  ondansetron (ZOFRAN) 4 MG tablet Take 2 tablets by mouth 2 (two) times daily.    Yes [provider]  Oxycodone HCl 10 MG TABS Take 10 mg by mouth every 6 (six) hours.   Yes [provider]  polyethylene glycol (MIRALAX / GLYCOLAX) packet Take 1 Container by mouth once.   Yes [provider]  senna (SENOKOT) 8.6  MG tablet Take 1 tablet by mouth daily.    Yes [provider]  traMADol (ULTRAM) 50 MG tablet Take 50 mg by mouth every 6 (six) hours as needed for moderate pain.   Yes [provider]  lidocaine-prilocaine (EMLA) cream Apply 1 application topically as needed. Patient not taking: Reported on 07/10/2018 11/27/17   Wyatt Portela, MD    Family History No family history on file.  Social History Social History   Tobacco Use  . Smoking status: Current Every Day Smoker    Years: 40.00  . Smokeless tobacco: Never Used  . Tobacco comment: 1-4 cigarettes per day  Substance Use Topics  . Alcohol use: Yes    Comment: special occasions  . Drug use: No     Allergies   Patient has no known allergies.   Review of Systems Review of Systems  Respiratory: Positive for shortness of breath.   Cardiovascular: Negative for chest pain, palpitations and leg swelling.  All other systems reviewed and are negative.    Physical Exam Updated Vital Signs BP 113/76   Pulse 86   Temp 97.8 F (36.6 C) (Oral)   Resp 13   Ht 5\' 9"  (1.753 m)   Wt 98.9 kg   SpO2 99%   BMI 32.19 kg/m   Physical Exam Vitals signs and nursing note reviewed.  Constitutional:      General: He is not in acute distress.    Appearance: He is well-developed.  HENT:     Head: Normocephalic and atraumatic.  Cardiovascular:     Rate and Rhythm: Normal rate and regular rhythm.     Heart sounds: Normal heart sounds. No murmur.  Pulmonary:     Effort: Pulmonary effort is normal. No respiratory distress.     Breath sounds: Normal breath sounds.  Abdominal:     General: There is no distension.     Palpations: Abdomen is soft.     Tenderness: There is no abdominal tenderness.  Musculoskeletal:     Right lower leg: He exhibits no tenderness. No edema.     Left lower leg: He exhibits no tenderness. No edema.  Skin:    General: Skin is warm and dry.  Neurological:     Mental Status: He is alert and  oriented to person, place, and time.      ED Treatments / Results  Labs (all labs ordered are listed, but only abnormal results are displayed) Labs Reviewed  COMPREHENSIVE METABOLIC PANEL - Abnormal; Notable for the following components:      Result Value   Sodium 128 (*)    Potassium 6.1 (*)    Chloride 94 (*)    CO2 21 (*)    Glucose, Bld 225 (*)    BUN 54 (*)    Creatinine, Ser 2.85 (*)    Albumin  3.1 (*)    Alkaline Phosphatase 135 (*)    GFR calc non Af Amer 21 (*)    GFR calc Af Amer 24 (*)    All other components within normal limits  CBC WITH DIFFERENTIAL/PLATELET - Abnormal; Notable for the following components:   WBC 20.2 (*)    RBC 3.90 (*)    Hemoglobin 11.9 (*)    HCT 36.8 (*)    RDW 17.0 (*)    Neutro Abs 18.0 (*)    Monocytes Absolute 1.2 (*)    Abs Immature Granulocytes 0.20 (*)    All other components within normal limits  I-STAT TROPONIN, ED    EKG EKG Interpretation  Date/Time:  Wednesday July 10 2018 22:02:20 EST Ventricular Rate:  96 PR Interval:    QRS Duration: 94 QT Interval:  329 QTC Calculation: 416 R Axis:   73 Text Interpretation:  Sinus rhythm Borderline short PR interval SINCE LAST TRACING HEART RATE HAS INCREASED Confirmed by Orlie Dakin [phone]) on 07/10/2018 10:22:29 PM   Radiology Dg Chest 2 View  Result Date: 07/10/2018 CLINICAL DATA:  Shortness of breath tonight. EXAM: CHEST - 2 VIEW COMPARISON:  06/01/2018 FINDINGS: Shallow inspiration. Mild cardiac enlargement. No vascular congestion or edema. Probable emphysematous changes in the lungs. Bronchial wall thickening suggesting chronic bronchitis. No airspace disease or consolidation. No blunting of costophrenic angles. No pneumothorax. Power port type central venous catheter with tip over the cavoatrial junction region. Degenerative changes in the spine. Vascular graft in the abdomen. IMPRESSION: Cardiac enlargement. Emphysematous and chronic bronchitic changes in the  lungs. No evidence of active pulmonary disease. Electronically Signed   By: Lucienne Capers M.D.   On: 07/10/2018 23:37    Procedures Procedures (including critical care time)  CRITICAL CARE Performed by: Ozella Almond Mandy Fitzwater  Total critical care time: 45 minutes  Critical care time was exclusive of separately billable procedures and treating other patients.  Critical care was necessary to treat or prevent imminent or life-threatening deterioration.  Critical care was time spent personally by me on the following activities: development of treatment plan with patient and/or surrogate as well as nursing, discussions with consultants, evaluation of patient's response to treatment, examination of patient, obtaining history from patient or surrogate, ordering and performing treatments and interventions, ordering and review of laboratory studies, ordering and review of radiographic studies, pulse oximetry and re-evaluation of patient's condition.   Medications Ordered in ED Medications  iopamidol (ISOVUE-370) 76 % injection (has no administration in time range)  insulin aspart (novoLOG) injection 10 Units (has no administration in time range)    And  dextrose 50 % solution 50 mL (has no administration in time range)  calcium gluconate 1 g/ 50 mL sodium chloride IVPB (has no administration in time range)  sodium chloride 0.9 % bolus 1,000 mL (1,000 mLs Intravenous New Bag/Given 07/10/18 2216)     Initial Impression / Assessment and Plan / ED Course  I have reviewed the triage vital signs and the nursing notes.  Pertinent labs & imaging results that were available during my care of the patient were reviewed by me and considered in my medical decision making (see chart for details).    Nathaniel Kirk is a 72 y.o. male who presents to ED for shortness of breath for a few days with acute worsening today. Patient has hx of urothelial carcinoma of the bladder which was diagnosed in February 2018.   He is status post chemo and radical cystectomy and lymphadenectomy which  was in October 2019.  He has a history of bilateral PE's and was on Lovenox, however has had postoperative bleeding leading to abdominal hematoma, therefore Lovenox was discontinued. He currently is not anticoagulation. Per family at bedside, patient did have a filter placed since surgery in October however I cannot find this in chart review. Initially, patient tachycardic 106-110. Initial BP 93/72. Given liter of fluids with improvement in vitals. HR now in 80's and most recent BP 113/76. Concerned for PE given shortness of breath, tachycardia and hypotension in cancer patient no longer on anti-coagulation. Unfortunately, he has acute worsening of his kidney function and cannot get contrast for CT angio. Discussed case with radiology, Dr. Gerilyn Nestle who approved for emergent VQ scan. Nuclear medicine tech called in to get imaging tonight. Patients labs also notable for hyperkalemia of 6.1. EKG reassuring. Given calcium, insulin and d50. Given new AKI, hyperkalemia and concerns for PE, feel that hospitalization is needed. Hospitalist consulted who will admit.   Patient seen by and discussed with Dr. Winfred Leeds who agrees with treatment plan.    Final Clinical Impressions(s) / ED Diagnoses   Final diagnoses:  Shortness of breath  Hyperkalemia  AKI (acute kidney injury) [facility])    ED Discharge Orders    None       Trinity Hyland, Ozella Almond, PA-C 07/11/18 0001    Orlie Dakin, MD 07/11/18 [phone]

## 2018-07-11 ENCOUNTER — Observation Stay (HOSPITAL_BASED_OUTPATIENT_CLINIC_OR_DEPARTMENT_OTHER): Payer: Medicare Other

## 2018-07-11 ENCOUNTER — Emergency Department (HOSPITAL_COMMUNITY): Payer: Medicare Other

## 2018-07-11 ENCOUNTER — Encounter (HOSPITAL_COMMUNITY): Payer: Self-pay | Admitting: Internal Medicine

## 2018-07-11 ENCOUNTER — Other Ambulatory Visit: Payer: Self-pay

## 2018-07-11 DIAGNOSIS — Z9221 Personal history of antineoplastic chemotherapy: Secondary | ICD-10-CM | POA: Diagnosis not present

## 2018-07-11 DIAGNOSIS — I959 Hypotension, unspecified: Secondary | ICD-10-CM | POA: Diagnosis present

## 2018-07-11 DIAGNOSIS — N390 Urinary tract infection, site not specified: Secondary | ICD-10-CM | POA: Diagnosis present

## 2018-07-11 DIAGNOSIS — R0989 Other specified symptoms and signs involving the circulatory and respiratory systems: Secondary | ICD-10-CM | POA: Diagnosis present

## 2018-07-11 DIAGNOSIS — R0609 Other forms of dyspnea: Secondary | ICD-10-CM

## 2018-07-11 DIAGNOSIS — E875 Hyperkalemia: Secondary | ICD-10-CM | POA: Diagnosis present

## 2018-07-11 DIAGNOSIS — C679 Malignant neoplasm of bladder, unspecified: Secondary | ICD-10-CM | POA: Diagnosis not present

## 2018-07-11 DIAGNOSIS — E861 Hypovolemia: Secondary | ICD-10-CM | POA: Diagnosis present

## 2018-07-11 DIAGNOSIS — R0602 Shortness of breath: Secondary | ICD-10-CM | POA: Diagnosis not present

## 2018-07-11 DIAGNOSIS — I5021 Acute systolic (congestive) heart failure: Secondary | ICD-10-CM | POA: Diagnosis not present

## 2018-07-11 DIAGNOSIS — R739 Hyperglycemia, unspecified: Secondary | ICD-10-CM | POA: Diagnosis present

## 2018-07-11 DIAGNOSIS — R0600 Dyspnea, unspecified: Secondary | ICD-10-CM | POA: Diagnosis present

## 2018-07-11 DIAGNOSIS — I429 Cardiomyopathy, unspecified: Secondary | ICD-10-CM | POA: Diagnosis present

## 2018-07-11 DIAGNOSIS — F17210 Nicotine dependence, cigarettes, uncomplicated: Secondary | ICD-10-CM | POA: Diagnosis present

## 2018-07-11 DIAGNOSIS — N184 Chronic kidney disease, stage 4 (severe): Secondary | ICD-10-CM | POA: Diagnosis present

## 2018-07-11 DIAGNOSIS — B961 Klebsiella pneumoniae [K. pneumoniae] as the cause of diseases classified elsewhere: Secondary | ICD-10-CM | POA: Diagnosis present

## 2018-07-11 DIAGNOSIS — N179 Acute kidney failure, unspecified: Secondary | ICD-10-CM | POA: Diagnosis present

## 2018-07-11 DIAGNOSIS — R630 Anorexia: Secondary | ICD-10-CM | POA: Diagnosis present

## 2018-07-11 DIAGNOSIS — B952 Enterococcus as the cause of diseases classified elsewhere: Secondary | ICD-10-CM | POA: Diagnosis present

## 2018-07-11 DIAGNOSIS — I2781 Cor pulmonale (chronic): Secondary | ICD-10-CM | POA: Diagnosis present

## 2018-07-11 DIAGNOSIS — Z8551 Personal history of malignant neoplasm of bladder: Secondary | ICD-10-CM | POA: Diagnosis not present

## 2018-07-11 DIAGNOSIS — E669 Obesity, unspecified: Secondary | ICD-10-CM | POA: Diagnosis present

## 2018-07-11 DIAGNOSIS — E871 Hypo-osmolality and hyponatremia: Secondary | ICD-10-CM | POA: Diagnosis present

## 2018-07-11 DIAGNOSIS — I493 Ventricular premature depolarization: Secondary | ICD-10-CM | POA: Diagnosis present

## 2018-07-11 DIAGNOSIS — I714 Abdominal aortic aneurysm, without rupture: Secondary | ICD-10-CM | POA: Diagnosis present

## 2018-07-11 DIAGNOSIS — I739 Peripheral vascular disease, unspecified: Secondary | ICD-10-CM | POA: Diagnosis present

## 2018-07-11 DIAGNOSIS — Z936 Other artificial openings of urinary tract status: Secondary | ICD-10-CM | POA: Diagnosis not present

## 2018-07-11 DIAGNOSIS — I361 Nonrheumatic tricuspid (valve) insufficiency: Secondary | ICD-10-CM

## 2018-07-11 DIAGNOSIS — I5020 Unspecified systolic (congestive) heart failure: Secondary | ICD-10-CM | POA: Diagnosis present

## 2018-07-11 DIAGNOSIS — N10 Acute tubulo-interstitial nephritis: Secondary | ICD-10-CM | POA: Diagnosis not present

## 2018-07-11 DIAGNOSIS — J449 Chronic obstructive pulmonary disease, unspecified: Secondary | ICD-10-CM | POA: Diagnosis present

## 2018-07-11 LAB — SODIUM, URINE, RANDOM: Sodium, Ur: 70 mmol/L

## 2018-07-11 LAB — URINALYSIS, ROUTINE W REFLEX MICROSCOPIC
Bilirubin Urine: NEGATIVE
Glucose, UA: NEGATIVE mg/dL
Ketones, ur: NEGATIVE mg/dL
Nitrite: NEGATIVE
Protein, ur: 100 mg/dL — AB
Specific Gravity, Urine: 1.016 (ref 1.005–1.030)
WBC, UA: >50 WBC/hpf — ABNORMAL HIGH (ref 0–5)
pH: 6 (ref 5.0–8.0)

## 2018-07-11 LAB — RESPIRATORY PANEL BY PCR
Adenovirus: NOT DETECTED
Bordetella pertussis: NOT DETECTED
CHLAMYDOPHILA PNEUMONIAE-RVPPCR: NOT DETECTED
CORONAVIRUS 229E-RVPPCR: NOT DETECTED
Coronavirus HKU1: NOT DETECTED
Coronavirus NL63: NOT DETECTED
Coronavirus OC43: NOT DETECTED
Influenza A: NOT DETECTED
Influenza B: NOT DETECTED
Metapneumovirus: NOT DETECTED
Mycoplasma pneumoniae: NOT DETECTED
Parainfluenza Virus 1: NOT DETECTED
Parainfluenza Virus 2: NOT DETECTED
Parainfluenza Virus 3: NOT DETECTED
Parainfluenza Virus 4: NOT DETECTED
Respiratory Syncytial Virus: NOT DETECTED
Rhinovirus / Enterovirus: NOT DETECTED

## 2018-07-11 LAB — CBC
HEMATOCRIT: 35.1 % — AB (ref 39.0–52.0)
Hemoglobin: 11 g/dL — ABNORMAL LOW (ref 13.0–17.0)
MCH: 29.4 pg (ref 26.0–34.0)
MCHC: 31.3 g/dL (ref 30.0–36.0)
MCV: 93.9 fL (ref 80.0–100.0)
Platelets: 160 10*3/uL (ref 150–400)
RBC: 3.74 MIL/uL — ABNORMAL LOW (ref 4.22–5.81)
RDW: 17.2 % — AB (ref 11.5–15.5)
WBC: 19.2 10*3/uL — ABNORMAL HIGH (ref 4.0–10.5)
nRBC: 0 % (ref 0.0–0.2)

## 2018-07-11 LAB — CBG MONITORING, ED
Glucose-Capillary: 118 mg/dL — ABNORMAL HIGH (ref 70–99)
Glucose-Capillary: 104 mg/dL — ABNORMAL HIGH (ref 70–99)
Glucose-Capillary: 104 mg/dL — ABNORMAL HIGH (ref 70–99)

## 2018-07-11 LAB — COMPREHENSIVE METABOLIC PANEL
ALT: 31 U/L (ref 0–44)
AST: 27 U/L (ref 15–41)
Albumin: 3.1 g/dL — ABNORMAL LOW (ref 3.5–5.0)
Alkaline Phosphatase: 121 U/L (ref 38–126)
Anion gap: 13 (ref 5–15)
BUN: 57 mg/dL — ABNORMAL HIGH (ref 8–23)
CO2: 18 mmol/L — ABNORMAL LOW (ref 22–32)
Calcium: 9.2 mg/dL (ref 8.9–10.3)
Chloride: 99 mmol/L (ref 98–111)
Creatinine, Ser: 2.83 mg/dL — ABNORMAL HIGH (ref 0.61–1.24)
GFR calc Af Amer: 24 mL/min — ABNORMAL LOW (ref >60)
GFR calc non Af Amer: 21 mL/min — ABNORMAL LOW (ref >60)
GLUCOSE: 149 mg/dL — AB (ref 70–99)
Potassium: 5.4 mmol/L — ABNORMAL HIGH (ref 3.5–5.1)
Sodium: 130 mmol/L — ABNORMAL LOW (ref 135–145)
Total Bilirubin: 0.5 mg/dL (ref 0.3–1.2)
Total Protein: 6.6 g/dL (ref 6.5–8.1)

## 2018-07-11 LAB — PHOSPHORUS: Phosphorus: 4.1 mg/dL (ref 2.5–4.6)

## 2018-07-11 LAB — CREATININE, URINE, RANDOM: Creatinine, Urine: 155.11 mg/dL

## 2018-07-11 LAB — HEMOGLOBIN A1C
Hgb A1c MFr Bld: 5.8 % — ABNORMAL HIGH (ref 4.8–5.6)
Mean Plasma Glucose: 119.76 mg/dL

## 2018-07-11 LAB — OSMOLALITY: Osmolality: 298 mOsm/kg — ABNORMAL HIGH (ref 275–295)

## 2018-07-11 LAB — ECHOCARDIOGRAM COMPLETE
Height: 69 in
Weight: 3488 oz

## 2018-07-11 LAB — GLUCOSE, CAPILLARY
GLUCOSE-CAPILLARY: 112 mg/dL — AB (ref 70–99)
Glucose-Capillary: 106 mg/dL — ABNORMAL HIGH (ref 70–99)

## 2018-07-11 LAB — TSH: TSH: 2.121 u[IU]/mL (ref 0.350–4.500)

## 2018-07-11 LAB — TROPONIN I
Troponin I: <0.03 ng/mL (ref <0.03)
Troponin I: <0.03 ng/mL (ref <0.03)

## 2018-07-11 LAB — CORTISOL: Cortisol, Plasma: 32.1 ug/dL

## 2018-07-11 LAB — OSMOLALITY, URINE: Osmolality, Ur: 318 mOsm/kg (ref 300–900)

## 2018-07-11 LAB — BRAIN NATRIURETIC PEPTIDE: B Natriuretic Peptide: 169.3 pg/mL — ABNORMAL HIGH (ref 0.0–100.0)

## 2018-07-11 LAB — MAGNESIUM: Magnesium: 2.1 mg/dL (ref 1.7–2.4)

## 2018-07-11 LAB — MRSA PCR SCREENING: MRSA by PCR: NEGATIVE

## 2018-07-11 MED ORDER — TECHNETIUM TO 99M ALBUMIN AGGREGATED
4.0000 | Freq: Once | INTRAVENOUS | Status: AC | PRN
  Administered 2018-07-11: 4 via INTRAVENOUS

## 2018-07-11 MED ORDER — HEPARIN SODIUM (PORCINE) 5000 UNIT/ML IJ SOLN
5000.0000 [IU] | Freq: Three times a day (TID) | INTRAMUSCULAR | Status: DC
  Filled 2018-07-11 (×2): qty 1

## 2018-07-11 MED ORDER — ONDANSETRON HCL 4 MG PO TABS: 4.0000 mg | ORAL_TABLET | Freq: Four times a day (QID) | ORAL | Status: DC | PRN

## 2018-07-11 MED ORDER — TECHNETIUM TC 99M DIETHYLENETRIAME-PENTAACETIC ACID
32.0000 | Freq: Once | INTRAVENOUS | Status: AC | PRN
  Administered 2018-07-11: 32 via RESPIRATORY_TRACT

## 2018-07-11 MED ORDER — ACETAMINOPHEN 325 MG PO TABS
650.0000 mg | ORAL_TABLET | Freq: Four times a day (QID) | ORAL | Status: DC | PRN
  Administered 2018-07-11 – 2018-07-12 (×2): 650 mg via ORAL
  Filled 2018-07-11 (×2): qty 2

## 2018-07-11 MED ORDER — PIPERACILLIN-TAZOBACTAM 3.375 G IVPB
3.3750 g | Freq: Three times a day (TID) | INTRAVENOUS | Status: DC
  Administered 2018-07-11 – 2018-07-12 (×4): 3.375 g via INTRAVENOUS
  Filled 2018-07-11 (×4): qty 50

## 2018-07-11 MED ORDER — FERROUS SULFATE 325 (65 FE) MG PO TABS
325.0000 mg | ORAL_TABLET | Freq: Every day | ORAL | Status: DC
  Administered 2018-07-12: 325 mg via ORAL
  Filled 2018-07-11: qty 1

## 2018-07-11 MED ORDER — ONDANSETRON HCL 4 MG/2ML IJ SOLN: 4.0000 mg | Freq: Four times a day (QID) | INTRAMUSCULAR | Status: DC | PRN

## 2018-07-11 MED ORDER — SENNA 8.6 MG PO TABS
1.0000 | ORAL_TABLET | Freq: Every day | ORAL | Status: DC
  Administered 2018-07-15: 8.6 mg via ORAL
  Filled 2018-07-11 (×4): qty 1

## 2018-07-11 MED ORDER — INSULIN ASPART 100 UNIT/ML ~~LOC~~ SOLN: 0.0000 [IU] | SUBCUTANEOUS | Status: DC

## 2018-07-11 MED ORDER — POLYETHYLENE GLYCOL 3350 17 G PO PACK
17.0000 g | PACK | Freq: Every day | ORAL | Status: DC | PRN
  Administered 2018-07-12: 17 g via ORAL
  Filled 2018-07-11: qty 1

## 2018-07-11 MED ORDER — TRAMADOL HCL 50 MG PO TABS
50.0000 mg | ORAL_TABLET | Freq: Four times a day (QID) | ORAL | Status: DC | PRN
  Administered 2018-07-11: 50 mg via ORAL
  Filled 2018-07-11: qty 1

## 2018-07-11 MED ORDER — SODIUM CHLORIDE 0.9 % IV BOLUS
500.0000 mL | Freq: Once | INTRAVENOUS | Status: AC
  Administered 2018-07-11: 500 mL via INTRAVENOUS

## 2018-07-11 MED ORDER — ACETAMINOPHEN 650 MG RE SUPP: 650.0000 mg | Freq: Four times a day (QID) | RECTAL | Status: DC | PRN

## 2018-07-11 MED ORDER — SODIUM CHLORIDE 0.9 % IV SOLN
INTRAVENOUS | Status: AC
  Administered 2018-07-11: 04:00:00 via INTRAVENOUS

## 2018-07-11 MED ORDER — TRAMADOL HCL 50 MG PO TABS: 50.0000 mg | ORAL_TABLET | Freq: Four times a day (QID) | ORAL | Status: DC | PRN

## 2018-07-11 MED ORDER — OXYCODONE HCL 5 MG PO TABS
10.0000 mg | ORAL_TABLET | Freq: Four times a day (QID) | ORAL | Status: DC
  Administered 2018-07-11: 10 mg via ORAL
  Filled 2018-07-11 (×2): qty 2

## 2018-07-11 MED ORDER — SODIUM CHLORIDE 0.9 % IV SOLN
INTRAVENOUS | Status: AC
  Administered 2018-07-11 – 2018-07-12 (×2): via INTRAVENOUS

## 2018-07-11 NOTE — ED Notes (Signed)
Pt transported to [facility] 

## 2018-07-11 NOTE — ED Notes (Signed)
Echocardiogram in process bedside at this time

## 2018-07-11 NOTE — Consult Note (Addendum)
CARDIOLOGY CONSULT NOTE  Patient ID: Nathaniel Kirk MRN: [id number] DOB/AGE: 73-Apr-1947 73 y.o.  Admit date: 07/10/2018 Referring Physician  Antonieta Pert, MD Primary Physician:  Willey Blade, MD Reason for Consultation  CHF and cardiomyopathy  HPI: Mr. Tavaras Goody is a 72 year old fairly ill Caucasian male with multiple medical comorbidity, He was diagnosed with acute pulmonary embolism sometime in August 2019 and was placed on anticoagulation with Lovenox, underwent radical cystectomy and urostomy placement in November 2019 at [facility].  He is completed 4 cycles of chemotherapy with gemcitabine and cisplatin.  Patient off of anticoagulation due to intra-abdominal bleeding.  He is admitted to the hospital on 07/10/2018 with acute shortness of breath, fatigue, started having shortness of breath over the past 1 week but got worse over the past 2 days and yesterday he could not do any activity without marked dyspnea.  He was also noted to have low blood pressure  by home health.  He was also in acute renal failure on admission, was hydrated with IV fluids, and an echocardiogram obtained revealed severe LV systolic dysfunction which is new, hence cardiology consultation was obtained.   Past Medical History:  Diagnosis Date  . AKI (acute kidney injury) ([place]) 06/2018  . Arthritis    knees  . Bladder tumor    removed 08/31/17  post op bleeding   . Cancer [facility])    Bladder  . 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 surgery scheduled with Dr. Bridgett Larsson 09-10-17  . Pneumonia    walking PNA  2016 or 2017  . UTI (urinary tract infection)      Past Surgical History:  Procedure Laterality Date  . ABDOMINAL AORTIC ENDOVASCULAR STENT GRAFT N/A 09/10/2017   Procedure: ABDOMINAL AORTIC ENDOVASCULAR STENT GRAFT WITH RIGHT AND  LEFT ILIAC BRANCH EXTENSIONS;  Surgeon: Conrad Salvo, MD;  Location: [facility];  Service: Vascular;  Laterality: N/A;  .  COLON SURGERY     diverticulitis foot of colon out  . CYSTOSCOPY W/ RETROGRADES Bilateral 08/31/2017   Procedure: CYSTOSCOPY WITH RETROGRADE PYELOGRAM;  Surgeon: Cleon Gustin, MD;  [facility];  Service: Urology;  Laterality: Bilateral;  . HERNIA REPAIR     umbilical  . IR FLUORO GUIDE PORT INSERTION RIGHT  12/11/2017  . IR US GUIDE VASC ACCESS RIGHT  12/11/2017  . TONSILLECTOMY     as kid  . TRANSURETHRAL RESECTION OF BLADDER TUMOR N/A 08/31/2017   Procedure: TRANSURETHRAL RESECTION OF BLADDER TUMOR (TURBT);  Surgeon: Cleon Gustin, MD;  [facility];  Service: Urology;  Laterality: N/A;     Family History  Problem Relation Age of Onset  . Prostate cancer Father   . CAD Neg Hx   . Diabetes Neg Hx      Social History: Social History   Socioeconomic History  . Marital status: Married    Spouse name: Not on file  . Number of children: Not on file  . Years of education: Not on file  . Highest education level: Not on file  Occupational History  . Not on file  Social Needs  . Financial resource strain: Not on file  . Food insecurity:    Worry: Not on file    Inability: Not on file  . Transportation needs:    Medical: Not on file    Non-medical: Not on file  Tobacco Use  . Smoking status: Former Smoker    Years: 40.00  Types: Cigarettes    Last attempt to quit: 05/13/2018    Years since quitting: 0.1  . Smokeless tobacco: Never Used  . Tobacco comment: 1-4 cigarettes per day  Substance and Sexual Activity  . Alcohol use: Yes    Comment: special occasions  . Drug use: No  . Sexual activity: Yes  Lifestyle  . Physical activity:    Days per week: Not on file    Minutes per session: Not on file  . Stress: Not on file  Relationships  . Social connections:    Talks on phone: Not on file    Gets together: Not on file    Attends religious service: Not on file    Active member of club or organization: Not on file    Attends meetings of clubs or  organizations: Not on file    Relationship status: Not on file  . Intimate partner violence:    Fear of current or ex partner: Not on file    Emotionally abused: Not on file    Physically abused: Not on file    Forced sexual activity: Not on file  Other Topics Concern  . Not on file  Social History Narrative  . Not on file     Medications Prior to Admission  Medication Sig Dispense Refill Last Dose  . acetaminophen (TYLENOL) 325 MG tablet Take 650 mg by mouth every 6 (six) hours as needed.   07/10/2018 at Unknown time  . FeFum-FePoly-FA-B Cmp-C-Biot (FOLIVANE-PLUS) CAPS Take by mouth daily.  5 Past Week at Unknown time  . ferrous sulfate 325 (65 FE) MG tablet Take 325 mg by mouth daily with breakfast.   Past Week at Unknown time  . ondansetron (ZOFRAN) 4 MG tablet Take 2 tablets by mouth 2 (two) times daily.    07/10/2018 at Unknown time  . Oxycodone HCl 10 MG TABS Take 10 mg by mouth every 6 (six) hours.   Past Month at Unknown time  . polyethylene glycol (MIRALAX / GLYCOLAX) packet Take 1 Container by mouth once.   Past Month at Unknown time  . senna (SENOKOT) 8.6 MG tablet Take 1 tablet by mouth daily.    07/09/2018 at Unknown time  . traMADol (ULTRAM) 50 MG tablet Take 50 mg by mouth every 6 (six) hours as needed for moderate pain.   07/09/2018 at Unknown time  . lidocaine-prilocaine (EMLA) cream Apply 1 application topically as needed. (Patient not taking: Reported on 07/10/2018) 30 g 0 Not Taking at Unknown time   Review of Systems  Constitutional: Negative.   HENT: Negative.   Eyes: Negative.   Respiratory: Positive for shortness of breath and wheezing.   Cardiovascular: Positive for PND. Negative for chest pain, palpitations, orthopnea and claudication.  Gastrointestinal: Negative.   Genitourinary: Negative.   Musculoskeletal: Negative.   Skin: Negative.   Endo/Heme/Allergies: Negative.   Psychiatric/Behavioral: Negative.   All other systems reviewed and are  negative.  Physical Exam: Blood pressure 106/69, pulse 70, temperature 97.6 F (36.4 C), temperature source Oral, resp. rate 19, height 5\' 9"  (1.753 m), weight 100.2 kg, SpO2 98 %.  Body mass index is 32.64 kg/m.  Physical Exam  Constitutional: He is oriented to person, place, and time. He appears well-developed and well-nourished. No distress.  Mildly obese.  Eyes: Conjunctivae are normal.  Neck: Neck supple. No JVD present.  Cardiovascular: Normal rate, regular rhythm, normal heart sounds and intact distal pulses. Exam reveals no gallop.  No murmur heard. Pulses:  Carotid pulses are on the right side with bruit and on the left side with bruit. Pulmonary/Chest: Effort normal and breath sounds normal.  Abdominal: Soft. Bowel sounds are normal.  Urostomy site appears healthy.  Musculoskeletal: Normal range of motion.        General: No edema.  Lymphadenopathy:    He has no cervical adenopathy.  Neurological: He is alert and oriented to person, place, and time.  Skin: Skin is warm and dry.  Psychiatric: He has a normal mood and affect.   Labs:   Lab Results  Component Value Date   WBC 19.2 (H) 07/11/2018   HGB 11.0 (L) 07/11/2018   HCT 35.1 (L) 07/11/2018   MCV 93.9 07/11/2018   PLT 160 07/11/2018    Recent Labs  Lab 07/11/18 0205  NA 130*  K 5.4*  CL 99  CO2 18*  BUN 57*  CREATININE 2.83*  CALCIUM 9.2  PROT 6.6  BILITOT 0.5  ALKPHOS 121  ALT 31  AST 27  GLUCOSE 149*    Lipid Panel  No results found for: CHOL, TRIG, HDL, CHOLHDL, VLDL, LDLCALC  BNP (last 3 results) Recent Labs    07/11/18 0059  BNP 169.3*    HEMOGLOBIN A1C Lab Results  Component Value Date   HGBA1C 5.8 (H) 07/11/2018   MPG 119.76 07/11/2018    Cardiac Panel (last 3 results) Recent Labs    07/11/18 0205 07/11/18 0730 07/11/18 1349  TROPONINI <0.03 <0.03 <0.03    TSH Recent Labs    07/11/18 0259  TSH 2.121    Radiology: Dg Chest 2 View  Result Date:  07/10/2018 CLINICAL DATA:  Shortness of breath tonight. EXAM: CHEST - 2 VIEW COMPARISON:  06/01/2018 FINDINGS: Shallow inspiration. Mild cardiac enlargement. No vascular congestion or edema. Probable emphysematous changes in the lungs. Bronchial wall thickening suggesting chronic bronchitis. No airspace disease or consolidation. No blunting of costophrenic angles. No pneumothorax. Power port type central venous catheter with tip over the cavoatrial junction region. Degenerative changes in the spine. Vascular graft in the abdomen. IMPRESSION: Cardiac enlargement. Emphysematous and chronic bronchitic changes in the lungs. No evidence of active pulmonary disease. Electronically Signed   By: Lucienne Capers M.D.   On: 07/10/2018 23:37   Nm Pulmonary Vent And Perf (v/q Scan)  Result Date: 07/11/2018 CLINICAL DATA:  Two day history of shortness of breath suddenly worse today. Cough. Quit smoking 3 months ago. Cancer patient. EXAM: NUCLEAR MEDICINE VENTILATION - PERFUSION LUNG SCAN TECHNIQUE: Ventilation images were obtained in multiple projections using inhaled aerosol Tc-74m DTPA. Perfusion images were obtained in multiple projections after intravenous injection of Tc-34m MAA. RADIOPHARMACEUTICALS:  31 mCi of Tc-69m DTPA aerosol inhalation and 4.15 mCi Tc54m MAA IV COMPARISON:  Chest radiograph 07/10/2018 FINDINGS: Ventilation: Heterogeneous ventilation with multiple small defects demonstrated in both lungs. Perfusion: Several small perfusion defects are demonstrated, either matching or smaller than ventilation abnormalities. No definite mismatched segmental perfusion defects identified. IMPRESSION: Low probability of pulmonary embolus. Electronically Signed   By: Lucienne Capers M.D.   On: 07/11/2018 02:44    Scheduled Meds: . ferrous sulfate  325 mg Oral Q breakfast  . heparin injection (subcutaneous)  5,000 Units Subcutaneous Q8H  . insulin aspart  0-9 Units Subcutaneous Q4H  . oxyCODONE  10 mg Oral Q6H   . senna  1 tablet Oral Daily   Continuous Infusions: . sodium chloride 50 mL/hr at 07/11/18 1800  . piperacillin-tazobactam (ZOSYN)  IV Stopped (07/11/18 1614)   PRN Meds:.acetaminophen **  OR** acetaminophen, ondansetron **OR** ondansetron (ZOFRAN) IV, polyethylene glycol, traMADol  CARDIAC STUDIES:  EKG 07/11/2018: Normal sinus rhythm with rate of 74 bpm, normal axis, frequent PVCs.  No evidence of ischemia.  Echo: 07/11/2018: Left ventricle: The cavity size was normal. Systolic function was  moderately to severely reduced. The estimated ejection fraction   was in the range of 30% to 35%. Images were inadequate for LV  wall motion assessment. - Ventricular septum: Septal motion showed abnormal function likely  secondary to conduction delay. The right ventricle was moderately dilated, systolic function was moderately reduced.  Mild TR.  No pulmonary hypertension. Compared to 04/11/2018, reduced LVEF is new.  Brief History for A/P: He is admitted to the hospital on 07/10/2018 with acute shortness of breath, fatigue, started having shortness of breath over the past 1 week but got worse over the past 2 days and yesterday he could not do any activity without marked dyspnea.  He was also noted to have low blood pressure  by home health.  He was also in acute renal failure on admission, was hydrated with IV fluids, and an echocardiogram obtained revealed severe LV systolic dysfunction which is new, hence cardiology consultation was obtained.   ASSESSMENT AND PLAN:  1.  Cardiomyopathy, new onset.  No EKG evidence of acute ischemia although PVCs are noted.  Serum troponins are negative.  Hence do not suspect ACS. 2.  Shortness of breath is multifactorial including underlying COPD, deconditioning, obesity, acute renal failure.  Do not suspect acute decompensated heart failure. 3.  Acute on chronic kidney disease stage IV, mild worsening in serum creatinine, expect improvement tomorrow since he is  hydrated. 4.  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  Not on anticoagulation due to high risk of bleed, patient had intra-abdominal bleed. 5. Bilateral carotid bruit  Recommendation: I would not recommend any cardiac medications at this point in view of low blood pressure.  His blood pressure when I examined him this evening was very low at 98/68 mmHg.  In spite of fluid resuscitation, his shortness of breath and dyspnea has improved.  Hence I do not suspect acute CHF. His VQ scan reveals significant ventilation abnormality and low risk for PE, also chest x-ray reveals presence of fairly advanced COPD.  He was smoking 2 packs of cigarettes a day until recently. Cisplatin has been known to cause cardiac abnormalities. I will see him back in the office for continued follow-up and care,.  I reviewed his care everywhere chart, I do not see any stress testing at any point.  This was a 60-minute encounter, with greater than 50% of the time spent directly with face-to-face encounter, review of external records and management of complex medical issues. He also have very loud carotid bruit, will f/u on this in the out patient basis.  Adrian Prows, MD 07/11/2018, 7:03 PM [facility]. [place] Pager: [phone] Office: [phone] If no answer Cell [phone]

## 2018-07-11 NOTE — ED Notes (Signed)
Breakfast tray ordered for pt at this time.

## 2018-07-11 NOTE — Progress Notes (Signed)
PROGRESS NOTE    Nathaniel Kirk  VFI:[id number] DOB: 1945/08/27 DOA: 07/10/2018 PCP: Willey Blade, MD   Brief Narrative: 72 year old male with history of bilateral PE in September 2019 status post IVC filter, muscle invasive bladder cancer diagnosed followed by [facility] status post chemo, radical cystectomy, complicated by postoperative bleeding abdominal hematoma, currently off anticoagulation, with urostomy in place who came to the ER with complaint of decreased oral intake for several days, fatigue weakness, shortness of breath, nonproductive cough.  Refer to HPI for more details.  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 On arrival his blood pressure 80/50 heart rate 110, Routine blood work showed acute renal failure creat 2.8, hyponatremia 128, hyperkalemia 6.1, leukocytosis 20,200.  His UA ( w urostomy) was abnormal WBC more than 50, few bacteria, large leukocytes.  Chest x-ray showed chronic emphysematous and bronchitic changes, no acute cardiopulmonary finding. Cortisol 32, TSH 2.1.  Patient had temporizing measures for hyperkalemia, IV fluids. He was admitted due to hypotension and shortness of breath.  Assessment & Plan:  Shortness of breath/dyspnea, with multiple vague complaints of generalized weakness, dry cough, decreased oral intake for few days.  He chest x-ray with chronic changes, VQ scan was obtained as unable to obtain CTA due to his renal injury and VQ scan was low probably due for PE.  This morning patient feels somewhat better after IV fluids.  He has no chest pain, he is saturating well on room air.  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 We will continue on hydration, symptomatic management.  His BNP is marginally elevated, no obvious signs of CHF.  echocardiogram to eval for EF pending.  Hypotension : His lactic acid on admission was normal.  Blood pressure did come up with IV fluids, HAD 1.5L BOLUS NSS.BP has  been soft in 90s throughout, his cortisol level was normal/appropriate at 32. NOW IN 87/67- will give additional ns bolus  His BNP is slightly high at 169.  Hold off on antihypertensive regimen, continue on IV fluids hydration, PRN bolus, provide supportive care.  Does have abnormal urinalysis however is from urostomy sample, he has leukocytosis.  His urine culture in process.  His recent urine culture in 11/10 grew Klebsiella and enterococcus . He was discharged on bactrim. He does have significant leukocytosis, will continue on empiric antibiotic and follow-up on the cultures-urine and blood.  Emphysema/COPD continue bronchodilators.  He is on room air.  Likely contributing to his shortness of breath.  He has a known history of smoking.  Bladder cancer,followed by [facility] status post chemo, radical cystectomy, complicated by postoperative bleeding abdominal hematoma, currently off anticoagulation, with urostomy in place  Hyponatremia : Suspecting from dehydration decreased oral intake, sodium improving with IV fluid hydration.  Continue the same and monitor.  AKI on CKD III, creatinine 2.2 on 06/01/18, and previously was in ~ 1.5-1.8.  Suspect component of dehydration from decreased oral intake, continue IV fluid hydration.  Hyperkalemia: Potassium is improving, continue IV fluid hydration.  Patient reports that he was on KIonex for similar hyperkalemia past 4 weeks, it was 6.2. bmp in am.  Hyperglycemia. his hba1c is 5.8.  Recent bilateral PE status post IVC, anticoagulation discontinued due to significant anemia/abdominal hematoma previously, he had needed 4 units PRBC.  VQ scan is low probability for PE.  Addendum: Reviewed echocardiogram, has a low EF 30 to 35% which is new, consulted on-call cardiology Dr Virgina Jock.  We will change the patient to inpatient status as patient will need more than 2 midnight stay to address his ongoing multiple issues as above with hyperkalemia,acute kidney  injury, systolic dysfunciton.   DVT prophylaxis: high risk, add heparin sq Code Status: FULL Family Communication: Emily at the bedside updated. Disposition Plan: SDU  Consultants: none  Procedures: TTE  Antimicrobials: ZOSYN  Subjective: Resting comfortably on room air.  Daughter at the bedside. Complains of some shortness of breath and nausea.  Denies chest pain, abdominal pain.  Objective: Vitals:   07/11/18 0700 07/11/18 0730 07/11/18 0830 07/11/18 0900  BP: 100/74 104/76 98/73 (!) 87/67  Pulse: 78 71 75 78  Resp: 15 10 (!) 21 17  Temp:      TempSrc:      SpO2: 98% 100% 98% 97%  Weight:      Height:        Intake/Output Summary (Last 24 hours) at 07/11/2018 0935 Last data filed at 07/11/2018 0258 Gross per 24 hour  Intake 1550 ml  Output -  Net 1550 ml   Filed Weights   07/10/18 2125  Weight: 98.9 kg   Weight change:   Body mass index is 32.19 kg/m.  Examination:  General exam: Appears calm and comfortable, obese, not in acute distress. HEENT:PERRL,Oral mucosa moist, Ear/Nose normal on gross exam Respiratory system: Bilateral diminished breath sounds, no crackles or wheezing.   Cardiovascular system: S1 & S2 heard,No JVD, murmurs.No pedal edema. Gastrointestinal system: Abdomen is  soft, non tender, non distended, BS +  Nervous System:Alert and oriented. No focal neurological deficits/moving extremities. Extremities: No edema, no clubbing,distal peripheral pulses palpable. Skin: No rashes, lesions,no icterus MSK: Normal muscle bulk,tone ,power  Data Reviewed: I have personally reviewed following labs and imaging studies  CBC: Recent Labs  Lab 07/10/18 2204 07/11/18 0205  WBC 20.2* 19.2*  NEUTROABS 18.0*  --   HGB 11.9* 11.0*  HCT 36.8* 35.1*  MCV 94.4 93.9  PLT 184 419   Basic Metabolic Panel: Recent Labs  Lab 07/10/18 2204 07/11/18 0205 07/11/18 0730  NA 128* 130*  --   K 6.1* 5.4*  --   CL 94* 99  --   CO2 21* 18*  --   GLUCOSE  225* 149*  --   BUN 54* 57*  --   CREATININE 2.85* 2.83*  --   CALCIUM 9.4 9.2  --   MG  --   --  2.1  PHOS  --   --  4.1   GFR: Estimated Creatinine Clearance: 27 mL/min (A) (by C-G formula based on SCr of 2.83 mg/dL (H)). Liver Function Tests: Recent Labs  Lab 07/10/18 2204 07/11/18 0205  AST 30 27  ALT 34 31  ALKPHOS 135* 121  BILITOT 0.6 0.5  PROT 7.1 6.6  ALBUMIN 3.1* 3.1*   No results for input(s): LIPASE, AMYLASE in the last 168 hours. No results for input(s): AMMONIA in the last 168 hours. Coagulation Profile: No results for input(s): INR, PROTIME in the last 168 hours. Cardiac Enzymes: Recent Labs  Lab 07/11/18 0205 07/11/18 0730  TROPONINI <0.03 <0.03   BNP (last 3 results) No results for input(s): PROBNP in the last 8760 hours. HbA1C: Recent Labs    07/11/18 0205  HGBA1C 5.8*   CBG: Recent Labs  Lab 07/11/18 0351 07/11/18 0747  GLUCAP 118* 104*   Lipid Profile: No results for input(s): CHOL, HDL, LDLCALC, TRIG, CHOLHDL, LDLDIRECT in the last 72 hours. Thyroid Function Tests: Recent Labs  07/11/18 0259  TSH 2.121   Anemia Panel: No results for input(s): VITAMINB12, FOLATE, FERRITIN, TIBC, IRON, RETICCTPCT in the last 72 hours. Sepsis Labs: No results for input(s): PROCALCITON, LATICACIDVEN in the last 168 hours.  Recent Results (from the past 240 hour(s))  Respiratory Panel by PCR     Status: None   Collection Time: 07/11/18  2:20 AM  Result Value Ref Range Status   Adenovirus NOT DETECTED NOT DETECTED Final   Coronavirus 229E NOT DETECTED NOT DETECTED Final   Coronavirus HKU1 NOT DETECTED NOT DETECTED Final   Coronavirus NL63 NOT DETECTED NOT DETECTED Final   Coronavirus OC43 NOT DETECTED NOT DETECTED Final   Metapneumovirus NOT DETECTED NOT DETECTED Final   Rhinovirus / Enterovirus NOT DETECTED NOT DETECTED Final   Influenza A NOT DETECTED NOT DETECTED Final   Influenza B NOT DETECTED NOT DETECTED Final   Parainfluenza Virus 1 NOT  DETECTED NOT DETECTED Final   Parainfluenza Virus 2 NOT DETECTED NOT DETECTED Final   Parainfluenza Virus 3 NOT DETECTED NOT DETECTED Final   Parainfluenza Virus 4 NOT DETECTED NOT DETECTED Final   Respiratory Syncytial Virus NOT DETECTED NOT DETECTED Final   Bordetella pertussis NOT DETECTED NOT DETECTED Final   Chlamydophila pneumoniae NOT DETECTED NOT DETECTED Final   Mycoplasma pneumoniae NOT DETECTED NOT DETECTED Final    Comment: Performed at [facility], [place]. [address], [place], [place] [address]      Radiology Studies: Dg Chest 2 View  Result Date: 07/10/2018 CLINICAL DATA:  Shortness of breath tonight. EXAM: CHEST - 2 VIEW COMPARISON:  06/01/2018 FINDINGS: Shallow inspiration. Mild cardiac enlargement. No vascular congestion or edema. Probable emphysematous changes in the lungs. Bronchial wall thickening suggesting chronic bronchitis. No airspace disease or consolidation. No blunting of costophrenic angles. No pneumothorax. Power port type central venous catheter with tip over the cavoatrial junction region. Degenerative changes in the spine. Vascular graft in the abdomen. IMPRESSION: Cardiac enlargement. Emphysematous and chronic bronchitic changes in the lungs. No evidence of active pulmonary disease. Electronically Signed   By: Lucienne Capers M.D.   On: 07/10/2018 23:37   Nm Pulmonary Vent And Perf (v/q Scan)  Result Date: 07/11/2018 CLINICAL DATA:  Two day history of shortness of breath suddenly worse today. Cough. Quit smoking 3 months ago. Cancer patient. EXAM: NUCLEAR MEDICINE VENTILATION - PERFUSION LUNG SCAN TECHNIQUE: Ventilation images were obtained in multiple projections using inhaled aerosol Tc-54m DTPA. Perfusion images were obtained in multiple projections after intravenous injection of Tc-75m MAA. RADIOPHARMACEUTICALS:  31 mCi of Tc-41m DTPA aerosol inhalation and 4.15 mCi Tc38m MAA IV COMPARISON:  Chest radiograph 07/10/2018 FINDINGS: Ventilation:  Heterogeneous ventilation with multiple small defects demonstrated in both lungs. Perfusion: Several small perfusion defects are demonstrated, either matching or smaller than ventilation abnormalities. No definite mismatched segmental perfusion defects identified. IMPRESSION: Low probability of pulmonary embolus. Electronically Signed   By: Lucienne Capers M.D.   On: 07/11/2018 02:44     Scheduled Meds: . ferrous sulfate  325 mg Oral Q breakfast  . insulin aspart  0-9 Units Subcutaneous Q4H  . oxyCODONE  10 mg Oral Q6H  . senna  1 tablet Oral Daily   Continuous Infusions: . sodium chloride 100 mL/hr at 07/11/18 0739     LOS: 0 days   Time spent: More than 50% of that time was spent in counseling and/or coordination of care.  Antonieta Pert, MD [facility] Pager [phone]  If 7PM-7AM, please contact night-coverage [email or web address] Password [email or web address]  07/11/2018, 9:35 AM

## 2018-07-11 NOTE — ED Notes (Signed)
Pt placed on hospital bed in position of comfort.

## 2018-07-11 NOTE — Progress Notes (Signed)
  Echocardiogram 2D Echocardiogram has been performed.  Darrin Apodaca T Madolyn Ackroyd 07/11/2018, 10:01 AM

## 2018-07-12 DIAGNOSIS — N10 Acute tubulo-interstitial nephritis: Secondary | ICD-10-CM

## 2018-07-12 LAB — GLUCOSE, CAPILLARY
Glucose-Capillary: 108 mg/dL — ABNORMAL HIGH (ref 70–99)
Glucose-Capillary: 116 mg/dL — ABNORMAL HIGH (ref 70–99)
Glucose-Capillary: 89 mg/dL (ref 70–99)
Glucose-Capillary: 90 mg/dL (ref 70–99)
Glucose-Capillary: 94 mg/dL (ref 70–99)

## 2018-07-12 LAB — BASIC METABOLIC PANEL
ANION GAP: 10 (ref 5–15)
BUN: 55 mg/dL — ABNORMAL HIGH (ref 8–23)
CALCIUM: 8.9 mg/dL (ref 8.9–10.3)
CO2: 21 mmol/L — ABNORMAL LOW (ref 22–32)
Chloride: 101 mmol/L (ref 98–111)
Creatinine, Ser: 2.58 mg/dL — ABNORMAL HIGH (ref 0.61–1.24)
GFR calc Af Amer: 27 mL/min — ABNORMAL LOW (ref >60)
GFR calc non Af Amer: 24 mL/min — ABNORMAL LOW (ref >60)
GLUCOSE: 125 mg/dL — AB (ref 70–99)
Potassium: 4.8 mmol/L (ref 3.5–5.1)
Sodium: 132 mmol/L — ABNORMAL LOW (ref 135–145)

## 2018-07-12 LAB — CBC
HCT: 31.4 % — ABNORMAL LOW (ref 39.0–52.0)
Hemoglobin: 9.7 g/dL — ABNORMAL LOW (ref 13.0–17.0)
MCH: 29.3 pg (ref 26.0–34.0)
MCHC: 30.9 g/dL (ref 30.0–36.0)
MCV: 94.9 fL (ref 80.0–100.0)
PLATELETS: 142 10*3/uL — AB (ref 150–400)
RBC: 3.31 MIL/uL — ABNORMAL LOW (ref 4.22–5.81)
RDW: 17 % — ABNORMAL HIGH (ref 11.5–15.5)
WBC: 12.8 10*3/uL — ABNORMAL HIGH (ref 4.0–10.5)
nRBC: 0 % (ref 0.0–0.2)

## 2018-07-12 MED ORDER — SODIUM CHLORIDE 0.9 % IV SOLN
INTRAVENOUS | Status: DC
  Administered 2018-07-12 – 2018-07-14 (×3): via INTRAVENOUS

## 2018-07-12 MED ORDER — SODIUM CHLORIDE 0.9 % IV SOLN: INTRAVENOUS | Status: DC

## 2018-07-12 MED ORDER — SODIUM CHLORIDE 0.9 % IV SOLN
1.0000 g | Freq: Two times a day (BID) | INTRAVENOUS | Status: DC
  Administered 2018-07-12 – 2018-07-14 (×4): 1 g via INTRAVENOUS
  Filled 2018-07-12 (×5): qty 1

## 2018-07-12 MED ORDER — SODIUM CHLORIDE 0.9 % IV SOLN: 1.0000 g | INTRAVENOUS | Status: DC

## 2018-07-12 MED ORDER — OXYCODONE HCL 5 MG PO TABS
10.0000 mg | ORAL_TABLET | ORAL | Status: DC | PRN
  Administered 2018-07-12 – 2018-07-14 (×5): 10 mg via ORAL
  Filled 2018-07-12 (×5): qty 2

## 2018-07-12 MED ORDER — ACETAMINOPHEN 325 MG PO TABS: 650.0000 mg | ORAL_TABLET | Freq: Four times a day (QID) | ORAL | Status: DC | PRN

## 2018-07-12 NOTE — Progress Notes (Signed)
Pharmacy Antibiotic Note  Kemond Amorin is a 72 y.o. male admitted on 07/10/2018 with ? ESBL pyelo.  Pharmacy has been consulted for Meropenem dosing.  Plan: Meropenem 1 gram iv Q 12 hours Pharmacy to sign off and follow peripherally for dose changes/ LOT  Height: 5\' 9"  (175.3 cm) Weight: 221 lb (100.2 kg) IBW/kg (Calculated) : 70.7  Temp (24hrs), Avg:98 F (36.7 C), Min:97.7 F (36.5 C), Max:98.7 F (37.1 C)  Recent Labs  Lab 07/10/18 2204 07/11/18 0205 07/12/18 0428  WBC 20.2* 19.2* 12.8*  CREATININE 2.85* 2.83* 2.58*    Estimated Creatinine Clearance: 29.8 mL/min (A) (by C-G formula based on SCr of 2.58 mg/dL (H)).    No Known Allergies  Thank you for allowing pharmacy to be a part of this patient's care. Anette Guarneri, PharmD [phone] 07/12/2018 4:29 PM

## 2018-07-12 NOTE — Progress Notes (Signed)
[facility] TEAM 1 - Stepdown/ICU TEAM  Nathaniel Kirk  VOH:[id number] DOB: [birth year] DOA: 07/10/2018 PCP: Willey Blade, MD    Brief Narrative:  72 year old male with history of bilateral PE in September 2019 status post IVC filter and muscle invasive bladder cancer followed at [facility] status post chemo and radical cystectomy w/ urostomy (complicated by postoperative bleeding / abdominal hematoma > off anticoagulation) who presented w/ fatigue and shortness of breath.  On arrival his BP was 80/50 w/ heart rate 110. Blood work showed creat 2.8, Na 128, and K+ 6.1. His UA (urostomy) was abnormal w/ WBC more than 50, few bacteria, large leukocytes.  Chest x-ray showed chronic emphysematous and bronchitic changes, no acute cardiopulmonary finding. Cortisol 32, TSH 2.1.   Significant Events: 12/18 admit   Subjective: Feeling "only slightly better." Denies cp, but is having nausea w/o vomiting. Still feels very weak in general. Poor appetite.   Assessment & Plan:  Shortness of breath VQ scan low probably for PE - RVP negative - appears to have been related to hypotension and generalized severe fatigue   Klebsiella UTI (ileal conduit) Pt at risk for ESBL infection - change to merem until sensitivities return   Newly diagnoses systolic CHF TTE 69/48 noted EF 30-35% - TTE at [facility] Sept 2019 noted EF 55% - perhaps due to chemotx - will need f/u TTE in 2-3 months - avoid ACE/ARB due to AKI  Hypotension cortisol level was normal - BP improving w/ volume resuscitation   COPD continue bronchodilators - well compensated at this time - suspect this is quite severe (long hx of 2PPD smoking)  Bladder cancer followed by [facility] - urostomy in place - s/p bladder resection and chemotx   Hyponatremia Due to hypovolemia - improving w/ volume resuscitation   AKI on CKD III creatinine 2.25 May 2018, and prior to that 1.5-1.8 - follow w/ ongoing hydration   Recent Labs  Lab 07/10/18 2204  07/11/18 0205 07/12/18 0428  CREATININE 2.85* 2.83* 2.58*    Hyperkalemia Due to above - resolved   Hyperglycemia A1c is 5.8, therefore NOT c/w DM   Recent bilateral PE status post IVC anticoagulation discontinued due to significant anemia - pt refuses SQ abdom injections (had signif abdom wall hematoma) - ambulate asap - utilize SCDs   DVT prophylaxis: SQ heparin  Code Status: FULL CODE Family Communication: spoke w/ dgtr at bedside  Disposition Plan: SDU  Consultants:  Cardiology   Antimicrobials:  Zosyn 12/19 > 12/20 Merem 12/20 >  Objective: Blood pressure 102/73, pulse 75, temperature 98.7 F (37.1 C), temperature source Oral, resp. rate 20, height 5\' 9"  (1.753 m), weight 100.2 kg, SpO2 99 %.  Intake/Output Summary (Last 24 hours) at 07/12/2018 1630 Last data filed at 07/12/2018 1119 Gross per 24 hour  Intake 682.06 ml  Output 800 ml  Net -117.94 ml   Filed Weights   07/10/18 2125 07/11/18 1231  Weight: 98.9 kg 100.2 kg    Examination: General: No acute respiratory distress Lungs: distant bs th/o - no wheezing - no focal crackles  Cardiovascular: Regular rate and rhythm without murmur gallop or rub normal S1 and S2 Abdomen: Nontender, overweights, soft, bowel sounds positive, no rebound,urostomy unremarkable Extremities: No significant cyanosis, clubbing, or edema bilateral lower extremities  CBC: Recent Labs  Lab 07/10/18 2204 07/11/18 0205 07/12/18 0428  WBC 20.2* 19.2* 12.8*  NEUTROABS 18.0*  --   --   HGB 11.9* 11.0* 9.7*  HCT 36.8* 35.1* 31.4*  MCV 94.4 93.9  94.9  PLT 184 160 371*   Basic Metabolic Panel: Recent Labs  Lab 07/10/18 2204 07/11/18 0205 07/11/18 0730 07/12/18 0428  NA 128* 130*  --  132*  K 6.1* 5.4*  --  4.8  CL 94* 99  --  101  CO2 21* 18*  --  21*  GLUCOSE 225* 149*  --  125*  BUN 54* 57*  --  55*  CREATININE 2.85* 2.83*  --  2.58*  CALCIUM 9.4 9.2  --  8.9  MG  --   --  2.1  --   PHOS  --   --  4.1  --     GFR: Estimated Creatinine Clearance: 29.8 mL/min (A) (by C-G formula based on SCr of 2.58 mg/dL (H)).  Liver Function Tests: Recent Labs  Lab 07/10/18 2204 07/11/18 0205  AST 30 27  ALT 34 31  ALKPHOS 135* 121  BILITOT 0.6 0.5  PROT 7.1 6.6  ALBUMIN 3.1* 3.1*    Cardiac Enzymes: Recent Labs  Lab 07/11/18 0205 07/11/18 0730 07/11/18 1349  TROPONINI <0.03 <0.03 <0.03    HbA1C: Hgb A1c MFr Bld  Date/Time Value Ref Range Status  07/11/2018 02:05 AM 5.8 (H) 4.8 - 5.6 % Final    Comment:    (NOTE) Pre diabetes:          5.7%-6.4% Diabetes:              >6.4% Glycemic control for   <7.0% adults with diabetes     CBG: Recent Labs  Lab 07/11/18 2011 07/12/18 0017 07/12/18 0441 07/12/18 0823 07/12/18 1141  GLUCAP 112* 90 116* 89 108*    Recent Results (from the past 240 hour(s))  Culture, blood (Routine X 2) w Reflex to ID Panel     Status: None (Preliminary result)   Collection Time: 07/11/18  1:00 AM  Result Value Ref Range Status   Specimen Description BLOOD RIGHT FOREARM  Final   Special Requests   Final    BOTTLES DRAWN AEROBIC AND ANAEROBIC Blood Culture results may not be optimal due to an inadequate volume of blood received in culture bottles   Culture   Final    NO GROWTH 1 DAY Performed at [facility], [place] [address], [place], [place] [address]    Report Status PENDING  Incomplete  Culture, blood (Routine X 2) w Reflex to ID Panel     Status: None (Preliminary result)   Collection Time: 07/11/18  1:05 AM  Result Value Ref Range Status   Specimen Description BLOOD RIGHT HAND  Final   Special Requests   Final    BOTTLES DRAWN AEROBIC AND ANAEROBIC Blood Culture results may not be optimal due to an inadequate volume of blood received in culture bottles   Culture   Final    NO GROWTH 1 DAY Performed at [facility], [place] [address], [place], [place] [address]    Report Status PENDING  Incomplete  Respiratory Panel by PCR     Status:  None   Collection Time: 07/11/18  2:20 AM  Result Value Ref Range Status   Adenovirus NOT DETECTED NOT DETECTED Final   Coronavirus 229E NOT DETECTED NOT DETECTED Final   Coronavirus HKU1 NOT DETECTED NOT DETECTED Final   Coronavirus NL63 NOT DETECTED NOT DETECTED Final   Coronavirus OC43 NOT DETECTED NOT DETECTED Final   Metapneumovirus NOT DETECTED NOT DETECTED Final   Rhinovirus / Enterovirus NOT DETECTED NOT DETECTED Final  Influenza A NOT DETECTED NOT DETECTED Final   Influenza B NOT DETECTED NOT DETECTED Final   Parainfluenza Virus 1 NOT DETECTED NOT DETECTED Final   Parainfluenza Virus 2 NOT DETECTED NOT DETECTED Final   Parainfluenza Virus 3 NOT DETECTED NOT DETECTED Final   Parainfluenza Virus 4 NOT DETECTED NOT DETECTED Final   Respiratory Syncytial Virus NOT DETECTED NOT DETECTED Final   Bordetella pertussis NOT DETECTED NOT DETECTED Final   Chlamydophila pneumoniae NOT DETECTED NOT DETECTED Final   Mycoplasma pneumoniae NOT DETECTED NOT DETECTED Final    Comment: Performed at [facility], [place] [address] [place], [place] [address]  Urine Culture     Status: Abnormal (Preliminary result)   Collection Time: 07/11/18  7:30 AM  Result Value Ref Range Status   Specimen Description URINE, CLEAN CATCH  Final   Special Requests   Final    NONE Performed at [facility], [place] [address] [place], [place] [address]    Culture >=100,000 COLONIES/mL KLEBSIELLA PNEUMONIAE (A)  Final   Report Status PENDING  Incomplete  MRSA PCR Screening     Status: None   Collection Time: 07/11/18  4:23 PM  Result Value Ref Range Status   MRSA by PCR NEGATIVE NEGATIVE Final    Comment:        The GeneXpert MRSA Assay (FDA approved for NASAL specimens only), is one component of a comprehensive MRSA colonization surveillance program. It is not intended to diagnose MRSA infection nor to guide or monitor treatment for MRSA infections. Performed at [facility], [place]  [address] [place], [place] [address]      Scheduled Meds: . senna  1 tablet Oral Daily   Continuous Infusions: . sodium chloride    . meropenem (MERREM) IV       LOS: 1 day   Cherene Altes, MD [facility] Office  [phone] Pager - Text Page per Amion  If 7PM-7AM, please contact night-coverage per Amion 07/12/2018, 4:30 PM

## 2018-07-13 DIAGNOSIS — N390 Urinary tract infection, site not specified: Principal | ICD-10-CM

## 2018-07-13 DIAGNOSIS — A419 Sepsis, unspecified organism: Secondary | ICD-10-CM

## 2018-07-13 LAB — CBC
HCT: 30.6 % — ABNORMAL LOW (ref 39.0–52.0)
Hemoglobin: 9.5 g/dL — ABNORMAL LOW (ref 13.0–17.0)
MCH: 29.3 pg (ref 26.0–34.0)
MCHC: 31 g/dL (ref 30.0–36.0)
MCV: 94.4 fL (ref 80.0–100.0)
PLATELETS: 149 10*3/uL — AB (ref 150–400)
RBC: 3.24 MIL/uL — ABNORMAL LOW (ref 4.22–5.81)
RDW: 17 % — ABNORMAL HIGH (ref 11.5–15.5)
WBC: 12.4 10*3/uL — ABNORMAL HIGH (ref 4.0–10.5)
nRBC: 0 % (ref 0.0–0.2)

## 2018-07-13 LAB — COMPREHENSIVE METABOLIC PANEL
ALT: 24 U/L (ref 0–44)
AST: 21 U/L (ref 15–41)
Albumin: 2.6 g/dL — ABNORMAL LOW (ref 3.5–5.0)
Alkaline Phosphatase: 99 U/L (ref 38–126)
Anion gap: 9 (ref 5–15)
BUN: 41 mg/dL — ABNORMAL HIGH (ref 8–23)
CALCIUM: 8.8 mg/dL — AB (ref 8.9–10.3)
CO2: 21 mmol/L — ABNORMAL LOW (ref 22–32)
Chloride: 104 mmol/L (ref 98–111)
Creatinine, Ser: 1.93 mg/dL — ABNORMAL HIGH (ref 0.61–1.24)
GFR calc Af Amer: 39 mL/min — ABNORMAL LOW (ref >60)
GFR calc non Af Amer: 34 mL/min — ABNORMAL LOW (ref >60)
Glucose, Bld: 87 mg/dL (ref 70–99)
Potassium: 4.9 mmol/L (ref 3.5–5.1)
Sodium: 134 mmol/L — ABNORMAL LOW (ref 135–145)
Total Bilirubin: 0.5 mg/dL (ref 0.3–1.2)
Total Protein: 6 g/dL — ABNORMAL LOW (ref 6.5–8.1)

## 2018-07-13 LAB — MAGNESIUM: Magnesium: 2.1 mg/dL (ref 1.7–2.4)

## 2018-07-13 MED ORDER — ALBUTEROL SULFATE (2.5 MG/3ML) 0.083% IN NEBU: 2.5000 mg | INHALATION_SOLUTION | RESPIRATORY_TRACT | Status: DC | PRN

## 2018-07-13 MED ORDER — UMECLIDINIUM BROMIDE 62.5 MCG/INH IN AEPB
1.0000 | INHALATION_SPRAY | Freq: Every day | RESPIRATORY_TRACT | Status: DC
  Administered 2018-07-14 – 2018-07-15 (×2): 1 via RESPIRATORY_TRACT
  Filled 2018-07-13: qty 7

## 2018-07-13 MED ORDER — FLUTICASONE FUROATE-VILANTEROL 200-25 MCG/INH IN AEPB
1.0000 | INHALATION_SPRAY | Freq: Every day | RESPIRATORY_TRACT | Status: DC
  Administered 2018-07-14 – 2018-07-15 (×2): 1 via RESPIRATORY_TRACT
  Filled 2018-07-13: qty 28

## 2018-07-13 NOTE — Progress Notes (Signed)
TEAM 1 - Stepdown/ICU TEAM  Vinayak Bobier  YPP:[id number] DOB: 27-Feb-1946 DOA: 07/10/2018 PCP: Willey Blade, MD    Brief Narrative:  72 year old male with history of bilateral PE in September 2019 status post IVC filter and muscle invasive bladder cancer followed at [facility] status post chemo and radical cystectomy w/ urostomy (complicated by postoperative bleeding / abdominal hematoma > off anticoagulation) who presented w/ fatigue and shortness of breath.  On arrival his BP was 80/50 w/ heart rate 110. Blood work showed creat 2.8, Na 128, and K+ 6.1. His UA (urostomy) was abnormal w/ WBC more than 50, few bacteria, large leukocytes.  Chest x-ray showed chronic emphysematous and bronchitic changes, no acute cardiopulmonary finding. Cortisol 32, TSH 2.1.   Significant Events: 12/18 admit   Subjective: The patient states he has no complaints when laying in bed.  He does complain of severe dyspnea on exertion which persists.  He also feels somewhat lightheaded when attempting to stand up.  He denies chest pressure or pain.  He denies abdominal pain.  He reports a poor appetite.  Assessment & Plan:  Klebsiella and Enterococcus faecalis UTI (ileal conduit) Kleb is not ESBL per culture testing - changed to merem yesterday while awaiting sensitivities - culture now revealing a second organsim, Enterococcus faecalis - cont merem for today - consider transition to oral levaquin 12/22 if continues to improve clinically - plan to complete and extended course of tx due to recent hx of recurring UTIs   Shortness of breath VQ scan low probably for PE - RVP negative - appears to have been related to hypotension, DH, COPD, and also likely poor CO in setting of subacute systolic CHF - see separate issues discussed below   Newly diagnoses systolic CHF TTE 45/80 noted EF 30-35% - TTE at [facility] Sept 2019 noted EF 55% - perhaps due to chemotx - will need f/u TTE in 2-3 months - avoid ACE/ARB due to  AKI - no sx to suggest CAD - has been seen by Cardiology - slow IVF now   [facility] Weights   07/10/18 2125 07/11/18 1231  Weight: 98.9 kg 100.2 kg    Hypotension cortisol level normal - BP improving w/ volume resuscitation - slow IVF and watch BP   COPD continue bronchodilators - well compensated at this time - suspect this is quite severe (long hx of 2PPD smoking) - transition to maintenance inhalers   Bladder cancer followed by [facility] - urostomy in place - s/p bladder resection and chemotx (Cisplatin)  Hyponatremia Due to hypovolemia - improving w/ volume resuscitation - follow   AKI on CKD III creatinine 2.25 May 2018, and prior to that 1.5-1.8 - follow w/ ongoing hydration   Recent Labs  Lab 07/10/18 2204 07/11/18 0205 07/12/18 0428 07/13/18 0421  CREATININE 2.85* 2.83* 2.58* 1.93*    Hyperkalemia Due to above - resolved   Hyperglycemia A1c is 5.8, therefore NOT c/w DM   Recent bilateral PE status post IVC anticoagulation discontinued due to significant anemia - pt refuses SQ abdom injections (had signif abdom wall hematoma) - ambulate asap - utilize SCDs   DVT prophylaxis: SCDs Code Status: FULL CODE Family Communication: spoke w/ older dgtr at bedside  Disposition Plan: SDU  Consultants:  Cardiology   Antimicrobials:  Zosyn 12/19 > 12/20 Merem 12/20 >  Objective: Blood pressure 107/60, pulse 66, temperature (!) 97.4 F (36.3 C), temperature source Oral, resp. rate 13, height 5\' 9"  (1.753 m), weight 100.2 kg, SpO2 97 %.  Intake/Output Summary (Last 24 hours) at 07/13/2018 1204 Last data filed at 07/13/2018 0803 Gross per 24 hour  Intake 1100 ml  Output 1800 ml  Net -700 ml   Filed Weights   07/10/18 2125 07/11/18 1231  Weight: 98.9 kg 100.2 kg    Examination: General: No acute respiratory distress - alert and oriented  Lungs: distant bs th/o w/o wheezing or crackles  Cardiovascular: Regular rate and rhythm - distant HS  Abdomen: Nontender,  overweight, soft, bowel sounds positive, no rebound Extremities: No edema bilateral lower extremities  CBC: Recent Labs  Lab 07/10/18 2204 07/11/18 0205 07/12/18 0428 07/13/18 0421  WBC 20.2* 19.2* 12.8* 12.4*  NEUTROABS 18.0*  --   --   --   HGB 11.9* 11.0* 9.7* 9.5*  HCT 36.8* 35.1* 31.4* 30.6*  MCV 94.4 93.9 94.9 94.4  PLT 184 160 142* 841*   Basic Metabolic Panel: Recent Labs  Lab 07/11/18 0205 07/11/18 0730 07/12/18 0428 07/13/18 0421  NA 130*  --  132* 134*  K 5.4*  --  4.8 4.9  CL 99  --  101 104  CO2 18*  --  21* 21*  GLUCOSE 149*  --  125* 87  BUN 57*  --  55* 41*  CREATININE 2.83*  --  2.58* 1.93*  CALCIUM 9.2  --  8.9 8.8*  MG  --  2.1  --  2.1  PHOS  --  4.1  --   --    GFR: Estimated Creatinine Clearance: 39.8 mL/min (A) (by C-G formula based on SCr of 1.93 mg/dL (H)).  Liver Function Tests: Recent Labs  Lab 07/10/18 2204 07/11/18 0205 07/13/18 0421  AST 30 27 21   ALT 34 31 24  ALKPHOS 135* 121 99  BILITOT 0.6 0.5 0.5  PROT 7.1 6.6 6.0*  ALBUMIN 3.1* 3.1* 2.6*    Cardiac Enzymes: Recent Labs  Lab 07/11/18 0205 07/11/18 0730 07/11/18 1349  TROPONINI <0.03 <0.03 <0.03    HbA1C: Hgb A1c MFr Bld  Date/Time Value Ref Range Status  07/11/2018 02:05 AM 5.8 (H) 4.8 - 5.6 % Final    Comment:    (NOTE) Pre diabetes:          5.7%-6.4% Diabetes:              >6.4% Glycemic control for   <7.0% adults with diabetes     CBG: Recent Labs  Lab 07/12/18 0017 07/12/18 0441 07/12/18 0823 07/12/18 1141 07/12/18 1816  GLUCAP 90 116* 89 108* 94    Recent Results (from the past 240 hour(s))  Culture, blood (Routine X 2) w Reflex to ID Panel     Status: None (Preliminary result)   Collection Time: 07/11/18  1:00 AM  Result Value Ref Range Status   Specimen Description BLOOD RIGHT FOREARM  Final   Special Requests   Final    BOTTLES DRAWN AEROBIC AND ANAEROBIC Blood Culture results may not be optimal due to an inadequate volume of blood  received in culture bottles   Culture   Final    NO GROWTH 2 DAYS Performed at [facility], [place] [address] [place], [place] [address]    Report Status PENDING  Incomplete  Culture, blood (Routine X 2) w Reflex to ID Panel     Status: None (Preliminary result)   Collection Time: 07/11/18  1:05 AM  Result Value Ref Range Status   Specimen Description BLOOD RIGHT HAND  Final   Special Requests   Final  BOTTLES DRAWN AEROBIC AND ANAEROBIC Blood Culture results may not be optimal due to an inadequate volume of blood received in culture bottles   Culture   Final    NO GROWTH 2 DAYS Performed at [facility], [place] [address], [place], [place] [address]    Report Status PENDING  Incomplete  Respiratory Panel by PCR     Status: None   Collection Time: 07/11/18  2:20 AM  Result Value Ref Range Status   Adenovirus NOT DETECTED NOT DETECTED Final   Coronavirus 229E NOT DETECTED NOT DETECTED Final   Coronavirus HKU1 NOT DETECTED NOT DETECTED Final   Coronavirus NL63 NOT DETECTED NOT DETECTED Final   Coronavirus OC43 NOT DETECTED NOT DETECTED Final   Metapneumovirus NOT DETECTED NOT DETECTED Final   Rhinovirus / Enterovirus NOT DETECTED NOT DETECTED Final   Influenza A NOT DETECTED NOT DETECTED Final   Influenza B NOT DETECTED NOT DETECTED Final   Parainfluenza Virus 1 NOT DETECTED NOT DETECTED Final   Parainfluenza Virus 2 NOT DETECTED NOT DETECTED Final   Parainfluenza Virus 3 NOT DETECTED NOT DETECTED Final   Parainfluenza Virus 4 NOT DETECTED NOT DETECTED Final   Respiratory Syncytial Virus NOT DETECTED NOT DETECTED Final   Bordetella pertussis NOT DETECTED NOT DETECTED Final   Chlamydophila pneumoniae NOT DETECTED NOT DETECTED Final   Mycoplasma pneumoniae NOT DETECTED NOT DETECTED Final    Comment: Performed at [facility], [place] [address], [place], [place] [address]  Urine Culture     Status: Abnormal (Preliminary result)   Collection Time: 07/11/18  7:30 AM    Result Value Ref Range Status   Specimen Description URINE, CLEAN CATCH  Final   Special Requests   Final    NONE Performed at [facility], [place] [address] [place], [place] [address]    Culture (A)  Final    >=100,000 COLONIES/mL KLEBSIELLA PNEUMONIAE >=100,000 COLONIES/mL ENTEROCOCCUS FAECALIS    Report Status PENDING  Incomplete   Organism ID, Bacteria KLEBSIELLA PNEUMONIAE (A)  Final      Susceptibility   Klebsiella pneumoniae - MIC*    AMPICILLIN >=32 RESISTANT Resistant     CEFAZOLIN <=4 SENSITIVE Sensitive     CEFTRIAXONE <=1 SENSITIVE Sensitive     CIPROFLOXACIN <=0.25 SENSITIVE Sensitive     GENTAMICIN <=1 SENSITIVE Sensitive     IMIPENEM <=0.25 SENSITIVE Sensitive     NITROFURANTOIN 64 INTERMEDIATE Intermediate     TRIMETH/SULFA <=20 SENSITIVE Sensitive     AMPICILLIN/SULBACTAM 4 SENSITIVE Sensitive     PIP/TAZO <=4 SENSITIVE Sensitive     Extended ESBL NEGATIVE Sensitive     * >=100,000 COLONIES/mL KLEBSIELLA PNEUMONIAE  MRSA PCR Screening     Status: None   Collection Time: 07/11/18  4:23 PM  Result Value Ref Range Status   MRSA by PCR NEGATIVE NEGATIVE Final    Comment:        The GeneXpert MRSA Assay (FDA approved for NASAL specimens only), is one component of a comprehensive MRSA colonization surveillance program. It is not intended to diagnose MRSA infection nor to guide or monitor treatment for MRSA infections. Performed at [facility], [place] [address] [place], [place] [address]      Scheduled Meds: . senna  1 tablet Oral Daily   Continuous Infusions: . sodium chloride 75 mL/hr at 07/13/18 1140  . meropenem (MERREM) IV 1 g (07/13/18 0514)     LOS: 2 days   Cherene Altes,  MD [facility] Office  [phone] Pager - Text Page per Shea Evans  If 7PM-7AM, please contact night-coverage per Amion 07/13/2018, 12:04 PM

## 2018-07-13 NOTE — Evaluation (Signed)
Physical Therapy Evaluation Patient Details Name: Nathaniel Kirk MRN: [id number] DOB: 1946/05/20 Today's Date: 07/13/2018   History of Present Illness  Pt is a 72 year old male presenting with fatigue and SOB. Pt with history of bilateral PE in September 2019 status post IVC filter and muscle invasive bladder cancer followed at [facility] status post chemo and radical cystectomy w/ urostomy in November 2019.    Clinical Impression  Pt presented supine in bed with HOB elevated, awake and willing to participate in therapy session. Prior to admission, pt reported that he was independent with all functional mobility and ADLs. Pt lives in a two level home with a few steps to enter. He reported that his bedroom/bathroom are on the main level. Pt also stated that he has two daughters who can provide 24/7 supervision/assistance upon d/c. Pt currently requires supervision for bed mobility and min A x2 for safety with transfers secondary to persistent dizziness. Of note pt's BP decreasing from 112/69 mmHg sitting EOB to 96/69 mmHg in standing. Pt's BP sitting in recliner with BLE elevated was 120/76 mmHg. Pt would continue to benefit from skilled physical therapy services at this time while admitted and after d/c to address the below listed limitations in order to improve overall safety and independence with functional mobility.     Follow Up Recommendations Home health PT;Supervision/Assistance - 24 hour    Equipment Recommendations  Wheelchair (measurements PT);Wheelchair cushion (measurements PT);3in1 (PT)    Recommendations for Other Services       Precautions / Restrictions Precautions Precautions: Fall Precaution Comments: urostomy R LQ, chest port Restrictions Weight Bearing Restrictions: No      Mobility  Bed Mobility Overal bed mobility: Needs Assistance Bed Mobility: Supine to Sit     Supine to sit: Supervision;HOB elevated     General bed mobility comments: supervision for  safety  Transfers Overall transfer level: Needs assistance Equipment used: Rolling walker (2 wheeled) Transfers: Sit to/from [facility] Sit to Stand: Min assist;+2 safety/equipment Stand pivot transfers: Min assist;+2 safety/equipment       General transfer comment: Min A for power up and safety. +2 for safety as pt with reports of persistent dizziness that did not pass with time. Pt performed sit<>stand x2 from EOB and pivoted from bed to chair x1  Ambulation/Gait             General Gait Details: deferred secondary to persistent dizziness and fatigue  Stairs            Wheelchair Mobility    Modified Rankin (Stroke Patients Only)       Balance Overall balance assessment: Needs assistance Sitting-balance support: No upper extremity supported;Feet supported Sitting balance-Leahy Scale: Fair     Standing balance support: No upper extremity supported;During functional activity Standing balance-Leahy Scale: Fair Standing balance comment: Able to maintain static standing without UE support                             Pertinent Vitals/Pain Pain Assessment: 0-10 Pain Score: 5  Pain Location: Stomach Pain Descriptors / Indicators: Discomfort;Grimacing;Guarding Pain Intervention(s): Monitored during session;Repositioned    Home Living Family/patient expects to be discharged to:: Private residence Living Arrangements: Children(Two daughters) Available Help at Discharge: Family;Available 24 hours/day Type of Home: House Home Access: Stairs to enter Entrance Stairs-Rails: Can reach both Entrance Stairs-Number of Steps: 3-4 Home Layout: Two level;Able to live on main level with bedroom/bathroom Home Equipment: Shower seat;Walker - 2  wheels;Adaptive equipment      Prior Function Level of Independence: Independent         Comments: Has been performing ADLs and light IADLs. Daughters perform IADLs and supervise bathing     Hand  Dominance   Dominant Hand: Right    Extremity/Trunk Assessment   Upper Extremity Assessment Upper Extremity Assessment: Generalized weakness    Lower Extremity Assessment Lower Extremity Assessment: Generalized weakness    Cervical / Trunk Assessment Cervical / Trunk Assessment: Other exceptions Cervical / Trunk Exceptions: s/p urostomy in Nov 2019  Communication   Communication: No difficulties  Cognition Arousal/Alertness: Awake/alert Behavior During Therapy: WFL for tasks assessed/performed Overall Cognitive Status: Within Functional Limits for tasks assessed                                        General Comments General comments (skin integrity, edema, etc.): BP sitting at EOB 112/69, standing 96/69, and sitting in recliner with BLE elevated 120/76    Exercises     Assessment/Plan    PT Assessment Patient needs continued PT services  PT Problem List Decreased strength;Decreased activity tolerance;Decreased balance;Decreased mobility;Decreased coordination;Decreased knowledge of use of DME;Decreased safety awareness;Decreased knowledge of precautions;Pain       PT Treatment Interventions DME instruction;Gait training;Stair training;Functional mobility training;Therapeutic exercise;Therapeutic activities;Balance training;Neuromuscular re-education;Patient/family education    PT Goals (Current goals can be found in the Care Plan section)  Acute Rehab PT Goals Patient Stated Goal: "Go home for Christmas" PT Goal Formulation: With patient Time For Goal Achievement: 07/27/18 Potential to Achieve Goals: Good    Frequency Min 3X/week   Barriers to discharge        Co-evaluation PT/OT/SLP Co-Evaluation/Treatment: Yes Reason for Co-Treatment: To address functional/ADL transfers;For patient/therapist safety PT goals addressed during session: Mobility/safety with mobility;Balance;Proper use of DME;Strengthening/ROM         AM-PAC PT "6 Clicks"  Mobility  Outcome Measure Help needed turning from your back to your side while in a flat bed without using bedrails?: None Help needed moving from lying on your back to sitting on the side of a flat bed without using bedrails?: None Help needed moving to and from a bed to a chair (including a wheelchair)?: A Little Help needed standing up from a chair using your arms (e.g., wheelchair or bedside chair)?: A Little Help needed to walk in hospital room?: A Little Help needed climbing 3-5 steps with a railing? : A Lot 6 Click Score: 19    End of Session Equipment Utilized During Treatment: Gait belt Activity Tolerance: Patient limited by fatigue Patient left: in chair;with call bell/phone within reach;with chair alarm set Nurse Communication: Mobility status PT Visit Diagnosis: Other abnormalities of gait and mobility (R26.89);Pain Pain - part of body: (abdomen)    Time: 1740-8144 PT Time Calculation (min) (ACUTE ONLY): 25 min   Charges:   PT Evaluation $PT Eval Moderate Complexity: 1 Mod          Sherie Don, PT, DPT  Acute Rehabilitation Services Pager [phone] Office [place] 07/13/2018, 2:27 PM

## 2018-07-13 NOTE — Evaluation (Signed)
Occupational Therapy Evaluation Patient Details Name: Nathaniel Kirk MRN: [id number] DOB: [birth year] Today's Date: 07/13/2018    History of Present Illness 72 year old male presenting with fatigue and SOB. Pt with history of bilateral PE in September 2019 status post IVC filter and muscle invasive bladder cancer followed at [facility] status post chemo and radical cystectomy w/ urostomy.   Clinical Impression   PTA, pt was living with his two daughters (who rotated staying with him) and performed ADLs with supervision for bathing. Pt currently requiring Min A for UB ADLs, Mod-Max A for LB ADLs, and Min A +2 for functional transfers. Pt presenting with fatigue and decreased activity tolerance; BP dropping to 96/69 with standing and reports dizziness. Pt would benefit from further acute OT to facilitate safe dc. Pending pt progress and family support, recommend dc to home with [place] for further OT to optimize safety, independence with ADLs, and return to PLOF.      Follow Up Recommendations  Home health OT;Supervision/Assistance - 24 hour(Pending family support)    Equipment Recommendations  3 in 1 bedside commode;Wheelchair (measurements OT);Wheelchair cushion (measurements OT)    Recommendations for Other Services PT consult     Precautions / Restrictions Precautions Precautions: Fall Restrictions Weight Bearing Restrictions: No      Mobility Bed Mobility Overal bed mobility: Needs Assistance Bed Mobility: Supine to Sit     Supine to sit: Supervision;HOB elevated     General bed mobility comments: supervision for safety  Transfers Overall transfer level: Needs assistance Equipment used: Rolling walker (2 wheeled) Transfers: Sit to/from [facility] Sit to Stand: Min assist;+2 safety/equipment Stand pivot transfers: Min assist;+2 safety/equipment       General transfer comment: Min A for power up and safety. +2 for safety    Balance Overall balance  assessment: Needs assistance Sitting-balance support: No upper extremity supported;Feet supported Sitting balance-Leahy Scale: Fair     Standing balance support: No upper extremity supported;During functional activity Standing balance-Leahy Scale: Fair Standing balance comment: Able to maintain static standing without UE support                           ADL either performed or assessed with clinical judgement   ADL Overall ADL's : Needs assistance/impaired Eating/Feeding: Set up;Supervision/ safety;Sitting   Grooming: Set up;Supervision/safety;Sitting   Upper Body Bathing: Minimal assistance;Sitting   Lower Body Bathing: Moderate assistance;Sit to/from stand   Upper Body Dressing : Minimal assistance;Sitting   Lower Body Dressing: Maximal assistance;Sit to/from stand Lower Body Dressing Details (indicate cue type and reason): Max A for donning sock due to difficulty and pain bending forward Toilet Transfer: Minimal assistance;+2 for physical assistance;Stand-pivot;RW(simulated to recliner) Toilet Transfer Details (indicate cue type and reason): Min A +2 for stand pivot to recliner.          Functional mobility during ADLs: Minimal assistance;+2 for physical assistance;Rolling walker(limited to stand pivot due to BP) General ADL Comments: Pt presenting with decreased strength and activity tolerance.      Vision         Perception     Praxis      Pertinent Vitals/Pain Pain Assessment: 0-10 Pain Score: 5  Pain Location: Stomach Pain Descriptors / Indicators: Constant;Discomfort;Grimacing Pain Intervention(s): Monitored during session;Repositioned;Limited activity within patient's tolerance     Hand Dominance Right   Extremity/Trunk Assessment Upper Extremity Assessment Upper Extremity Assessment: Generalized weakness   Lower Extremity Assessment Lower Extremity Assessment: Defer to PT evaluation  Cervical / Trunk Assessment Cervical / Trunk  Assessment: Other exceptions Cervical / Trunk Exceptions: s/p urostomy   Communication Communication Communication: No difficulties   Cognition Arousal/Alertness: Awake/alert Behavior During Therapy: WFL for tasks assessed/performed Overall Cognitive Status: Within Functional Limits for tasks assessed                                     General Comments  BP sitting at EOB 112/69, standing 96/69, and sitting in recliner with BLE elevated 120/76    Exercises     Shoulder Instructions      Home Living Family/patient expects to be discharged to:: Private residence Living Arrangements: Children(Two daughters) Available Help at Discharge: Family;Available 24 hours/day Type of Home: House Home Access: Stairs to enter [employer] of Steps: 3-4 Entrance Stairs-Rails: Can reach both Home Layout: Two level;Able to live on main level with bedroom/bathroom Alternate Level Stairs-Number of Steps: flight   Bathroom Shower/Tub: Hospital doctor Toilet: Handicapped height     Home Equipment: Clinical cytogeneticist - 2 wheels;Adaptive equipment Adaptive Equipment: Sock aid        Prior Functioning/Environment Level of Independence: Independent        Comments: Has been performing ADLs and light IADLs. Daughters perform IADLs and supervise bathing        OT Problem List: Decreased strength;Decreased range of motion;Decreased activity tolerance;Impaired balance (sitting and/or standing);Decreased knowledge of precautions;Decreased knowledge of use of DME or AE;Pain      OT Treatment/Interventions: Self-care/ADL training;Therapeutic exercise;Energy conservation;DME and/or AE instruction;Therapeutic activities;Patient/family education    OT Goals(Current goals can be found in the care plan section) Acute Rehab OT Goals Patient Stated Goal: "Go home for Christmas" OT Goal Formulation: With patient Time For Goal Achievement: 07/27/18 Potential to  Achieve Goals: Good ADL Goals Pt Will Perform Grooming: with set-up;with supervision;standing Pt Will Perform Lower Body Dressing: with set-up;with supervision;sit to/from stand;with adaptive equipment Pt Will Transfer to Toilet: with set-up;with supervision;ambulating;bedside commode Pt Will Perform Toileting - Clothing Manipulation and hygiene: with set-up;with supervision;sit to/from stand;sitting/lateral leans  OT Frequency: Min 2X/week   Barriers to D/C:            Co-evaluation              AM-PAC OT "6 Clicks" Daily Activity     Outcome Measure Help from another person eating meals?: None Help from another person taking care of personal grooming?: A Little Help from another person toileting, which includes using toliet, bedpan, or urinal?: A Little Help from another person bathing (including washing, rinsing, drying)?: A Lot Help from another person to put on and taking off regular upper body clothing?: A Little Help from another person to put on and taking off regular lower body clothing?: A Lot 6 Click Score: 17   End of Session Equipment Utilized During Treatment: Gait belt;Rolling walker Nurse Communication: Mobility status  Activity Tolerance: Patient tolerated treatment well Patient left: in chair;with call bell/phone within reach  OT Visit Diagnosis: Unsteadiness on feet (R26.81);Other abnormalities of gait and mobility (R26.89);Muscle weakness (generalized) (M62.81);Pain Pain - part of body: (stomach)                Time: 5366-4403 OT Time Calculation (min): 23 min Charges:  OT General Charges $OT Visit: 1 Visit OT Evaluation $OT Eval Moderate Complexity: Fults, OTR/L Acute Rehab Pager: [phone] Office: [phone]  Lianne Bushy  M Daneille Desilva 07/13/2018, 1:53 PM

## 2018-07-13 NOTE — Progress Notes (Signed)
PT Progress Note for LMN for W/C  Patient admitted secondary to SOB and is s/p urostomy secondary to bladder cancer which impairs their ability to perform daily activities in the home.  A walker alone will not resolve the issues with performing activities of daily living. A wheelchair will allow patient to safely perform daily activities.  The patient can self propel in the home or has a caregiver who can provide assistance.     Sherie Don, [place], DPT  Acute Rehabilitation Services Pager [phone] Office [phone]

## 2018-07-14 LAB — CBC
HCT: 31 % — ABNORMAL LOW (ref 39.0–52.0)
Hemoglobin: 9.5 g/dL — ABNORMAL LOW (ref 13.0–17.0)
MCH: 29.3 pg (ref 26.0–34.0)
MCHC: 30.6 g/dL (ref 30.0–36.0)
MCV: 95.7 fL (ref 80.0–100.0)
Platelets: 165 10*3/uL (ref 150–400)
RBC: 3.24 MIL/uL — ABNORMAL LOW (ref 4.22–5.81)
RDW: 17.2 % — ABNORMAL HIGH (ref 11.5–15.5)
WBC: 11.2 10*3/uL — ABNORMAL HIGH (ref 4.0–10.5)
nRBC: 0 % (ref 0.0–0.2)

## 2018-07-14 LAB — COMPREHENSIVE METABOLIC PANEL
ALK PHOS: 102 U/L (ref 38–126)
ALT: 23 U/L (ref 0–44)
AST: 20 U/L (ref 15–41)
Albumin: 2.5 g/dL — ABNORMAL LOW (ref 3.5–5.0)
Anion gap: 7 (ref 5–15)
BUN: 33 mg/dL — AB (ref 8–23)
CO2: 21 mmol/L — ABNORMAL LOW (ref 22–32)
Calcium: 8.8 mg/dL — ABNORMAL LOW (ref 8.9–10.3)
Chloride: 106 mmol/L (ref 98–111)
Creatinine, Ser: 1.68 mg/dL — ABNORMAL HIGH (ref 0.61–1.24)
GFR calc Af Amer: 46 mL/min — ABNORMAL LOW (ref >60)
GFR calc non Af Amer: 40 mL/min — ABNORMAL LOW (ref >60)
Glucose, Bld: 86 mg/dL (ref 70–99)
Potassium: 4.9 mmol/L (ref 3.5–5.1)
Sodium: 134 mmol/L — ABNORMAL LOW (ref 135–145)
Total Bilirubin: 0.6 mg/dL (ref 0.3–1.2)
Total Protein: 5.7 g/dL — ABNORMAL LOW (ref 6.5–8.1)

## 2018-07-14 LAB — URINE CULTURE: Culture: >=100000 — AB

## 2018-07-14 LAB — MAGNESIUM: Magnesium: 2 mg/dL (ref 1.7–2.4)

## 2018-07-14 MED ORDER — AMOXICILLIN-POT CLAVULANATE 875-125 MG PO TABS
1.0000 | ORAL_TABLET | Freq: Two times a day (BID) | ORAL | Status: DC
  Administered 2018-07-14 – 2018-07-15 (×3): 1 via ORAL
  Filled 2018-07-14 (×3): qty 1

## 2018-07-14 NOTE — Progress Notes (Signed)
Pharmacy Antibiotic Note  Nathaniel Kirk is a 72 y.o. male admitted on 07/10/2018 with UTI.  Pharmacy has been consulted for levofloxacin dosing. Patient growing both Klebsiella (S-amp/sulbactam) and enterococcus (S-ampicillin) in urine. D/W Dr. Thereasa Solo. Will change patient to Amoxacillin/Clavulanate to treat both organisms.  Plan: Amoxicillin Clavulanate 875mg  PO q12h D/c levofloxacin  Height: 5\' 9"  (175.3 cm) Weight: 221 lb (100.2 kg) IBW/kg (Calculated) : 70.7  Temp (24hrs), Avg:97.8 F (36.6 C), Min:97.6 F (36.4 C), Max:98.2 F (36.8 C)  Recent Labs  Lab 07/10/18 2204 07/11/18 0205 07/12/18 0428 07/13/18 0421 07/14/18 0416  WBC 20.2* 19.2* 12.8* 12.4* 11.2*  CREATININE 2.85* 2.83* 2.58* 1.93* 1.68*    Estimated Creatinine Clearance: 45.7 mL/min (A) (by C-G formula based on SCr of 1.68 mg/dL (H)).    No Known Allergies  Microbiology results:  12/19 UCx: Klebsiella Pneumoniae (S-amp/sulbactam), Enterococcus faelcalis (Sensitive to Ampicillin)    Nathaniel Kirk, PharmD, [place] Pager: [phone] Please utilize Amion for appropriate phone number to reach the unit pharmacist ([place])   07/14/2018 12:06 PM

## 2018-07-14 NOTE — Progress Notes (Signed)
[place] TEAM 1 - Stepdown/ICU TEAM  Nathaniel Kirk  OHY:[id number] DOB: Jan 23, 1946 DOA: 07/10/2018 PCP: Willey Blade, MD    Brief Narrative:  72 year old male with history of bilateral PE in September 2019 status post IVC filter and muscle invasive bladder cancer followed at [facility] s/p chemo and radical cystectomy w/ urostomy (complicated by postoperative bleeding / abdominal hematoma > off anticoagulation) who presented w/ fatigue and shortness of breath.  On arrival his BP was 80/50 w/ heart rate 110. Blood work showed creat 2.8, Na 128, and K+ 6.1. His UA (urostomy) was abnormal w/ WBC more than 50, few bacteria, large leukocytes.  Chest x-ray showed chronic emphysematous and bronchitic changes, no acute cardiopulmonary finding. Cortisol 32, TSH 2.1.   Significant Events: 12/18 admit   Subjective: The patient states he is slowly making progress.  He is moving around his room a bit more and is slightly less short of breath when doing so, though not yet back to his baseline.  His appetite is slowly improving.  He is beginning to notice a mild amount of edema in his legs.  He denies abdominal pain but reports a poor appetite.  Assessment & Plan:  Klebsiella and Enterococcus faecalis UTI (ileal conduit) Kleb is not ESBL per culture testing - changed to merem 12/20 while awaiting sensitivities - culture now revealing a second organsim, Enterococcus faecalis - transition to oral levaquin 12/22 - plan to complete an extended course of tx due to recent hx of recurring UTIs   Shortness of breath VQ scan low probably for PE - RVP negative - appears to have been related to hypotension, DH, COPD, and also likely poor CO in setting of subacute systolic CHF - see separate issues discussed below   Newly diagnoses systolic CHF TTE 94/85 noted EF 30-35% - TTE at [facility] Sept 2019 noted EF 55% - perhaps due to chemotx - will need f/u TTE in 2-3 months - avoid ACE/ARB due to AKI - no sx to suggest CAD -  has been seen by Cardiology - stop IVF now  [facility] Weights   07/10/18 2125 07/11/18 1231  Weight: 98.9 kg 100.2 kg    Hypotension cortisol level normal - BP improved w/ volume resuscitation   COPD continue bronchodilators - well compensated at this time - suspect this is quite severe (long hx of 2PPD smoking) - transitioned to maintenance inhalers   Bladder cancer followed by [facility] - urostomy in place - s/p bladder resection and chemotx (Cisplatin)  Hyponatremia Due to hypovolemia - improved w/ volume resuscitation  AKI on CKD III creatinine 2.25 May 2018, and prior to that 1.5-1.8 - improved w/ hydration   Recent Labs  Lab 07/10/18 2204 07/11/18 0205 07/12/18 0428 07/13/18 0421 07/14/18 0416  CREATININE 2.85* 2.83* 2.58* 1.93* 1.68*    Hyperkalemia Due to above - resolved   Hyperglycemia A1c is 5.8, therefore NOT c/w DM   Recent bilateral PE status post IVC anticoagulation discontinued due to significant anemia - pt refuses SQ abdom injections (had signif abdom wall hematoma) - ambulate asap - utilize SCDs   DVT prophylaxis: SCDs Code Status: FULL CODE Family Communication: No family present at time of exam today Disposition Plan: Anticipate discharge home 12/23 if renal function continues to improve, volume status stable, and patient able to ambulate without significant hypoxia  Consultants:  Cardiology   Antimicrobials:  Zosyn 12/19 > 12/20 Merem 12/20 > 12/22 Levaquin 12/22 >  Objective: Blood pressure 101/67, pulse 68, temperature 97.7 F (36.5  C), resp. rate 18, height 5\' 9"  (1.753 m), weight 100.2 kg, SpO2 96 %.  Intake/Output Summary (Last 24 hours) at 07/14/2018 1048 Last data filed at 07/14/2018 0500 Gross per 24 hour  Intake 1313.58 ml  Output 1500 ml  Net -186.42 ml   Filed Weights   07/10/18 2125 07/11/18 1231  Weight: 98.9 kg 100.2 kg    Examination: General: No acute respiratory distress - A&O Lungs: distant bs th/o - CTA - no  wheezing  Cardiovascular: RRR - no M or rub  Abdomen: Nontender, overweight, soft, bowel sounds positive, no rebound Extremities: trace B LE edema   CBC: Recent Labs  Lab 07/10/18 2204  07/12/18 0428 07/13/18 0421 07/14/18 0416  WBC 20.2*   < > 12.8* 12.4* 11.2*  NEUTROABS 18.0*  --   --   --   --   HGB 11.9*   < > 9.7* 9.5* 9.5*  HCT 36.8*   < > 31.4* 30.6* 31.0*  MCV 94.4   < > 94.9 94.4 95.7  PLT 184   < > 142* 149* 165   < > = values in this interval not displayed.   Basic Metabolic Panel: Recent Labs  Lab 07/11/18 0730 07/12/18 0428 07/13/18 0421 07/14/18 0416  NA  --  132* 134* 134*  K  --  4.8 4.9 4.9  CL  --  101 104 106  CO2  --  21* 21* 21*  GLUCOSE  --  125* 87 86  BUN  --  55* 41* 33*  CREATININE  --  2.58* 1.93* 1.68*  CALCIUM  --  8.9 8.8* 8.8*  MG 2.1  --  2.1 2.0  PHOS 4.1  --   --   --    GFR: Estimated Creatinine Clearance: 45.7 mL/min (A) (by C-G formula based on SCr of 1.68 mg/dL (H)).  Liver Function Tests: Recent Labs  Lab 07/10/18 2204 07/11/18 0205 07/13/18 0421 07/14/18 0416  AST 30 27 21 20   ALT 34 31 24 23   ALKPHOS 135* 121 99 102  BILITOT 0.6 0.5 0.5 0.6  PROT 7.1 6.6 6.0* 5.7*  ALBUMIN 3.1* 3.1* 2.6* 2.5*    Cardiac Enzymes: Recent Labs  Lab 07/11/18 0205 07/11/18 0730 07/11/18 1349  TROPONINI <0.03 <0.03 <0.03    HbA1C: Hgb A1c MFr Bld  Date/Time Value Ref Range Status  07/11/2018 02:05 AM 5.8 (H) 4.8 - 5.6 % Final    Comment:    (NOTE) Pre diabetes:          5.7%-6.4% Diabetes:              >6.4% Glycemic control for   <7.0% adults with diabetes     CBG: Recent Labs  Lab 07/12/18 0017 07/12/18 0441 07/12/18 0823 07/12/18 1141 07/12/18 1816  GLUCAP 90 116* 89 108* 94    Recent Results (from the past 240 hour(s))  Culture, blood (Routine X 2) w Reflex to ID Panel     Status: None (Preliminary result)   Collection Time: 07/11/18  1:00 AM  Result Value Ref Range Status   Specimen Description BLOOD  RIGHT FOREARM  Final   Special Requests   Final    BOTTLES DRAWN AEROBIC AND ANAEROBIC Blood Culture results may not be optimal due to an inadequate volume of blood received in culture bottles   Culture   Final    NO GROWTH 3 DAYS Performed at [facility], [place] [address] [place], [place] [address]  Report Status PENDING  Incomplete  Culture, blood (Routine X 2) w Reflex to ID Panel     Status: None (Preliminary result)   Collection Time: 07/11/18  1:05 AM  Result Value Ref Range Status   Specimen Description BLOOD RIGHT HAND  Final   Special Requests   Final    BOTTLES DRAWN AEROBIC AND ANAEROBIC Blood Culture results may not be optimal due to an inadequate volume of blood received in culture bottles   Culture   Final    NO GROWTH 3 DAYS Performed at [facility], [place] [address] [place], [place] [address]    Report Status PENDING  Incomplete  Respiratory Panel by PCR     Status: None   Collection Time: 07/11/18  2:20 AM  Result Value Ref Range Status   Adenovirus NOT DETECTED NOT DETECTED Final   Coronavirus 229E NOT DETECTED NOT DETECTED Final   Coronavirus HKU1 NOT DETECTED NOT DETECTED Final   Coronavirus NL63 NOT DETECTED NOT DETECTED Final   Coronavirus OC43 NOT DETECTED NOT DETECTED Final   Metapneumovirus NOT DETECTED NOT DETECTED Final   Rhinovirus / Enterovirus NOT DETECTED NOT DETECTED Final   Influenza A NOT DETECTED NOT DETECTED Final   Influenza B NOT DETECTED NOT DETECTED Final   Parainfluenza Virus 1 NOT DETECTED NOT DETECTED Final   Parainfluenza Virus 2 NOT DETECTED NOT DETECTED Final   Parainfluenza Virus 3 NOT DETECTED NOT DETECTED Final   Parainfluenza Virus 4 NOT DETECTED NOT DETECTED Final   Respiratory Syncytial Virus NOT DETECTED NOT DETECTED Final   Bordetella pertussis NOT DETECTED NOT DETECTED Final   Chlamydophila pneumoniae NOT DETECTED NOT DETECTED Final   Mycoplasma pneumoniae NOT DETECTED NOT DETECTED Final    Comment: Performed  at [facility], [place] [address], [place], [place] [address]  Urine Culture     Status: Abnormal   Collection Time: 07/11/18  7:30 AM  Result Value Ref Range Status   Specimen Description URINE, CLEAN CATCH  Final   Special Requests   Final    NONE Performed at [facility], [place] [address] [place], [place] [address]    Culture (A)  Final    >=100,000 COLONIES/mL KLEBSIELLA PNEUMONIAE >=100,000 COLONIES/mL ENTEROCOCCUS FAECALIS    Report Status 07/14/2018 FINAL  Final   Organism ID, Bacteria KLEBSIELLA PNEUMONIAE (A)  Final   Organism ID, Bacteria ENTEROCOCCUS FAECALIS (A)  Final      Susceptibility   Enterococcus faecalis - MIC*    AMPICILLIN <=2 SENSITIVE Sensitive     LEVOFLOXACIN 1 SENSITIVE Sensitive     NITROFURANTOIN <=16 SENSITIVE Sensitive     VANCOMYCIN <=0.5 SENSITIVE Sensitive     * >=100,000 COLONIES/mL ENTEROCOCCUS FAECALIS   Klebsiella pneumoniae - MIC*    AMPICILLIN >=32 RESISTANT Resistant     CEFAZOLIN <=4 SENSITIVE Sensitive     CEFTRIAXONE <=1 SENSITIVE Sensitive     CIPROFLOXACIN <=0.25 SENSITIVE Sensitive     GENTAMICIN <=1 SENSITIVE Sensitive     IMIPENEM <=0.25 SENSITIVE Sensitive     NITROFURANTOIN 64 INTERMEDIATE Intermediate     TRIMETH/SULFA <=20 SENSITIVE Sensitive     AMPICILLIN/SULBACTAM 4 SENSITIVE Sensitive     PIP/TAZO <=4 SENSITIVE Sensitive     Extended ESBL NEGATIVE Sensitive     * >=100,000 COLONIES/mL KLEBSIELLA PNEUMONIAE  MRSA PCR Screening     Status: None   Collection Time: 07/11/18  4:23 PM  Result Value Ref Range Status   MRSA by PCR  NEGATIVE NEGATIVE Final    Comment:        The GeneXpert MRSA Assay (FDA approved for NASAL specimens only), is one component of a comprehensive MRSA colonization surveillance program. It is not intended to diagnose MRSA infection nor to guide or monitor treatment for MRSA infections. Performed at [facility], [place] [address] [place], [place] [address]      Scheduled  Meds: . fluticasone furoate-vilanterol  1 puff Inhalation Daily  . senna  1 tablet Oral Daily  . umeclidinium bromide  1 puff Inhalation Daily   Continuous Infusions: . sodium chloride 50 mL/hr at 07/14/18 0553  . meropenem (MERREM) IV 1 g (07/14/18 0554)     LOS: 3 days   Cherene Altes, MD [facility] Office  [phone] Pager - Text Page per Amion  If 7PM-7AM, please contact night-coverage per Amion 07/14/2018, 10:48 AM

## 2018-07-15 DIAGNOSIS — J441 Chronic obstructive pulmonary disease with (acute) exacerbation: Secondary | ICD-10-CM | POA: Diagnosis present

## 2018-07-15 DIAGNOSIS — I5021 Acute systolic (congestive) heart failure: Secondary | ICD-10-CM | POA: Diagnosis present

## 2018-07-15 LAB — BASIC METABOLIC PANEL
Anion gap: 8 (ref 5–15)
BUN: 27 mg/dL — ABNORMAL HIGH (ref 8–23)
CO2: 21 mmol/L — ABNORMAL LOW (ref 22–32)
CREATININE: 1.46 mg/dL — AB (ref 0.61–1.24)
Calcium: 8.7 mg/dL — ABNORMAL LOW (ref 8.9–10.3)
Chloride: 108 mmol/L (ref 98–111)
GFR calc Af Amer: 55 mL/min — ABNORMAL LOW (ref >60)
GFR calc non Af Amer: 47 mL/min — ABNORMAL LOW (ref >60)
Glucose, Bld: 94 mg/dL (ref 70–99)
Potassium: 5.3 mmol/L — ABNORMAL HIGH (ref 3.5–5.1)
Sodium: 137 mmol/L (ref 135–145)

## 2018-07-15 LAB — CBC
HCT: 32 % — ABNORMAL LOW (ref 39.0–52.0)
Hemoglobin: 9.7 g/dL — ABNORMAL LOW (ref 13.0–17.0)
MCH: 29 pg (ref 26.0–34.0)
MCHC: 30.3 g/dL (ref 30.0–36.0)
MCV: 95.5 fL (ref 80.0–100.0)
NRBC: 0 % (ref 0.0–0.2)
Platelets: 172 10*3/uL (ref 150–400)
RBC: 3.35 MIL/uL — ABNORMAL LOW (ref 4.22–5.81)
RDW: 17.5 % — AB (ref 11.5–15.5)
WBC: 12.9 10*3/uL — ABNORMAL HIGH (ref 4.0–10.5)

## 2018-07-15 LAB — MAGNESIUM: Magnesium: 2 mg/dL (ref 1.7–2.4)

## 2018-07-15 MED ORDER — HEPARIN SOD (PORK) LOCK FLUSH 100 UNIT/ML IV SOLN
500.0000 [IU] | INTRAVENOUS | Status: AC | PRN
  Administered 2018-07-15: 500 [IU]

## 2018-07-15 MED ORDER — FLUTICASONE FUROATE-VILANTEROL 200-25 MCG/INH IN AEPB: 1.0000 | INHALATION_SPRAY | Freq: Every day | RESPIRATORY_TRACT | 0 refills | Status: AC

## 2018-07-15 MED ORDER — UMECLIDINIUM BROMIDE 62.5 MCG/INH IN AEPB: 1.0000 | INHALATION_SPRAY | Freq: Every day | RESPIRATORY_TRACT | 0 refills | Status: AC

## 2018-07-15 MED ORDER — AMOXICILLIN-POT CLAVULANATE 875-125 MG PO TABS: 1.0000 | ORAL_TABLET | Freq: Two times a day (BID) | ORAL | 0 refills | Status: AC

## 2018-07-15 MED FILL — BREO ELLIPTA 200-25 MCG INH: 200-25 | 30 days supply | Qty: 60 | Fill #0

## 2018-07-15 MED FILL — INCRUSE ELLIPTA 62.5 MCG IN: 62.5 | 30 days supply | Qty: 30 | Fill #0

## 2018-07-15 MED FILL — AMOX-CLAV 875-125 MG TABLET: 875-125 | 10 days supply | Qty: 20 | Fill #0

## 2018-07-15 NOTE — Care Management Note (Addendum)
Case Management Note  Patient Details  Name: Elward Nocera MRN: [id number] Date of Birth: 10-Jan-1946  Subjective/Objective:       Sepsis/UTI. Hx of  bilateral PE in September 2019 status post IVC filter and muscle invasive bladder cancer. From home alone. States independent with ADL's, no DME.           Ellery Plunk (Daughter) Jeannette Corpus (Daughter)      [phone]        PCP: Willey Blade  Action/Plan: Transition to home with home health services to follow. Pt states daughter to provide 24/7 supervision assistance once d/c. Pt declines wheelchair and BSC. Pt has transportation to home.  Expected Discharge Date:                  Expected Discharge Plan:  [place]  In-House Referral:  NA  Discharge planning Services  CM Consult  Post Acute Care Choice:  NA Choice offered to:  Patient(Requested [facility])  DME Arranged:    DME Agency:     HH Arranged:  PT, OT [place] Agency:  [facility], pending MD's order... NCM has requested order from MD. Status of Service:  Completed, signed off  If discussed at [place] of Stay Meetings, dates discussed:    Additional Comments:  Sharin Mons, RN 07/15/2018, 8:05 AM

## 2018-07-15 NOTE — Progress Notes (Signed)
Occupational Therapy Treatment Patient Details Name: Nathaniel Kirk MRN: [id number] DOB: 1945-12-21 Today's Date: 07/15/2018    History of present illness Pt is a 72 y.o. male admitted 07/11/18 with worsening SOB, weakness and cough; found to be hypotensive. VQ scan low probability for PE. CXR showed chronic emphysematous and bronchitic changes. 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   OT comments  Pt progressing towards established OT goals and demonstrating increased activity tolerance. Pt performing grooming at sink and functional mobility with supervision. SpO2 98-99% on RA and BP 121/68. Continue to recommend dc home with HHOT and will contioneu to follow acutely. Answered pt questions in preparation for possible dc later today.    Follow Up Recommendations  Home health OT;Supervision/Assistance - 24 hour(Pending family support)    Equipment Recommendations  3 in 1 bedside commode;Wheelchair (measurements OT);Wheelchair cushion (measurements OT)    Recommendations for Other Services PT consult    Precautions / Restrictions Precautions Precautions: Fall;Other (comment) Precaution Comments: urostomy R LQ, chest port Restrictions Weight Bearing Restrictions: No       Mobility Bed Mobility Overal bed mobility: Modified Independent             General bed mobility comments: OOB with PT upon arrival  Transfers Overall transfer level: Needs assistance Equipment used: None Transfers: Sit to/from Stand Sit to Stand: Supervision         General transfer comment: Supervision for line management    Balance Overall balance assessment: Needs assistance Sitting-balance support: No upper extremity supported;Feet supported Sitting balance-Leahy Scale: Good     Standing balance support: No upper extremity supported;During functional activity Standing balance-Leahy Scale: Good Standing balance comment:  Supervision for safety                           ADL either performed or assessed with clinical judgement   ADL Overall ADL's : Needs assistance/impaired     Grooming: Wash/dry hands;Standing;Supervision/safety;Set up Grooming Details (indicate cue type and reason): Pt performing hand hygiene at sink with supervision. Leaning heavy on sink due to fatigue                 Toilet Transfer: Set up;Supervision/safety;Ambulation;Regular Glass blower/designer Details (indicate cue type and reason): supervision. Returning from restroom upon arrival   Surprise Details (indicate cue type and reason): Returning from restroom upon arrival     Functional mobility during ADLs: Supervision/safety General ADL Comments: Pt performing ADLs at supervision level and demonstrating increased activity tolerance. Planning for dc home later today.      Vision       Perception     Praxis      Cognition Arousal/Alertness: Awake/alert Behavior During Therapy: WFL for tasks assessed/performed Overall Cognitive Status: Within Functional Limits for tasks assessed                                          Exercises     Shoulder Instructions       General Comments SpO2 99-98% on RA. BP 121/68 after activity    Pertinent Vitals/ Pain       Pain Assessment: No/denies pain  Home Living  Prior Functioning/Environment              Frequency  Min 2X/week        Progress Toward Goals  OT Goals(current goals can now be found in the care plan section)  Progress towards OT goals: Progressing toward goals  Acute Rehab OT Goals Patient Stated Goal: "Go home for Christmas" OT Goal Formulation: With patient Time For Goal Achievement: 07/27/18 Potential to Achieve Goals: Good ADL Goals Pt Will Perform Grooming: with set-up;with supervision;standing Pt Will Perform Lower  Body Dressing: with set-up;with supervision;sit to/from stand;with adaptive equipment Pt Will Transfer to Toilet: with set-up;with supervision;ambulating;bedside commode Pt Will Perform Toileting - Clothing Manipulation and hygiene: with set-up;with supervision;sit to/from stand;sitting/lateral leans  Plan Discharge plan remains appropriate    Co-evaluation                 AM-PAC OT "6 Clicks" Daily Activity     Outcome Measure   Help from another person eating meals?: None Help from another person taking care of personal grooming?: A Little Help from another person toileting, which includes using toliet, bedpan, or urinal?: A Little Help from another person bathing (including washing, rinsing, drying)?: A Lot Help from another person to put on and taking off regular upper body clothing?: A Little Help from another person to put on and taking off regular lower body clothing?: A Lot 6 Click Score: 17    End of Session    OT Visit Diagnosis: Unsteadiness on feet (R26.81);Other abnormalities of gait and mobility (R26.89);Muscle weakness (generalized) (M62.81);Pain Pain - part of body: (stomach)   Activity Tolerance Patient tolerated treatment well   Patient Left in chair;with call bell/phone within reach   Nurse Communication Mobility status        Time: 0910-0919 OT Time Calculation (min): 9 min  Charges: OT General Charges $OT Visit: 1 Visit OT Treatments $Self Care/Home Management : 8-22 mins  [place], OTR/L Acute Rehab Pager: [phone] Office: [place] 07/15/2018, 11:32 AM

## 2018-07-15 NOTE — Progress Notes (Signed)
SATURATION QUALIFICATIONS: (This note is used to comply with regulatory documentation for home oxygen)  Patient Saturations on Room Air at Rest = 97%  Patient Saturations on Room Air while Ambulating = 91%   

## 2018-07-15 NOTE — Progress Notes (Signed)
Nathaniel Kirk to be D/C'd to home with home health per MD order.  Discussed with the patient and all questions fully answered.  An After Visit Summary was printed and given to the patient. Patient received prescriptions.  D/c education completed with patient/family including follow up instructions, medication list, d/c activities limitations if indicated, with other d/c instructions as indicated by MD - patient able to verbalize understanding, all questions fully answered.   Patient instructed to return to ED, call 911, or call MD for any changes in condition.   Patient escorted via [place], and D/C home via private auto.  Manuella Ghazi 07/15/2018 11:22 AM

## 2018-07-15 NOTE — Discharge Instructions (Signed)
Pyelonephritis, Adult Pyelonephritis is a kidney infection. The kidneys are the organs that filter a person's blood and move waste out of the bloodstream and into the urine. Urine passes from the kidneys, through the ureters, and into the bladder. There are two main types of pyelonephritis:  Infections that come on quickly without any warning (acute pyelonephritis).  Infections that last for a long period of time (chronic pyelonephritis). In most cases, the infection clears up with treatment and does not cause further problems. More severe infections or chronic infections can sometimes spread to the bloodstream or lead to other problems with the kidneys. What are the causes? This condition is usually caused by:  Bacteria traveling from the bladder to the kidney through infected urine. The urine in the bladder can become infected with bacteria from: ? Bladder infection (cystitis). ? Inflammation of the prostate gland (prostatitis). ? Sexual intercourse, in females.  Bacteria traveling from the bloodstream to the kidney. What increases the risk? This condition is more likely to develop in:  Pregnant women.  Older people.  People who have diabetes.  People who have kidney stones or bladder stones.  People who have other abnormalities of the kidney or ureter.  People who have a catheter placed in the bladder.  People who have cancer.  People who are sexually active.  Women who use spermicides.  People who have had a prior urinary tract infection. What are the signs or symptoms? Symptoms of this condition include:  Frequent urination.  Strong or persistent urge to urinate.  Burning or stinging when urinating.  Abdominal pain.  Back pain.  Pain in the side or flank area.  Fever.  Chills.  Blood in the urine, or dark urine.  Nausea.  Vomiting. How is this diagnosed? This condition may be diagnosed based on:  Medical history and physical exam.  Urine  tests.  Blood tests. You may also have imaging tests of the kidneys, such as an ultrasound or CT scan. How is this treated? Treatment for this condition may depend on the severity of the infection.  If the infection is mild and is found early, you may be treated with antibiotic medicines taken by mouth. You will need to drink fluids to remain hydrated.  If the infection is more severe, you may need to stay in the hospital and receive antibiotics given directly into a vein through an IV tube. You may also need to receive fluids through an IV tube if you are not able to remain hydrated. After your hospital stay, you may need to take oral antibiotics for a period of time. Other treatments may be required, depending on the cause of the infection. Follow these instructions at home: Medicines  Take over-the-counter and prescription medicines only as told by your health care provider.  If you were prescribed an antibiotic medicine, take it as told by your health care provider. Do not stop taking the antibiotic even if you start to feel better. General instructions  Drink enough fluid to keep your urine clear or pale yellow.  Avoid caffeine, tea, and carbonated beverages. They tend to irritate the bladder.  Urinate often. Avoid holding in urine for long periods of time.  Urinate before and after sex.  After a bowel movement, women should cleanse from front to back. Use each tissue only once.  Keep all follow-up visits as told by your health care provider. This is important. Contact a health care provider if:  Your symptoms do not get better after 2  days of treatment.  Your symptoms get worse.  You have a fever. Get help right away if:  You are unable to take your antibiotics or fluids.  You have shaking chills.  You vomit.  You have severe flank or back pain.  You have extreme weakness or fainting. This information is not intended to replace advice given to you by your health  care provider. Make sure you discuss any questions you have with your health care provider. Document Released: 07/10/2005 Document Revised: 12/16/2015 Document Reviewed: 11/02/2014 Elsevier Interactive Patient Education  2019 [employer].   Heart Failure Heart failure is a condition in which the heart has trouble pumping blood because it has become weak or stiff. This means that the heart does not pump blood efficiently for the body to work well. For some people with heart failure, fluid may back up into the lungs and there may be swelling (edema) in the lower legs. Heart failure is usually a long-term (chronic) condition. It is important for you to take good care of yourself and follow the treatment plan from your health care provider. What are the causes? This condition is caused by some health problems, including:  High blood pressure (hypertension). Hypertension causes the heart muscle to work harder than normal. High blood pressure eventually causes the heart to become stiff and weak.  Coronary artery disease (CAD). CAD is the buildup of cholesterol and fat (plaques) in the arteries of the heart.  Heart attack (myocardial infarction). Injured tissue, which is caused by the heart attack, does not contract as well and the heart's ability to pump blood is weakened.  Abnormal heart valves. When the heart valves do not open and close properly, the heart muscle must pump harder to keep the blood flowing.  Heart muscle disease (cardiomyopathy or myocarditis). Heart muscle disease is damage to the heart muscle from a variety of causes, such as drug or alcohol abuse, infections, or unknown causes. These can increase the risk of heart failure.  Lung disease. When the lungs do not work properly, the heart must work harder. What increases the risk? Risk of heart failure increases as a person ages. This condition is also more likely to develop in people who:  Are overweight.  Are male.  Smoke  or chew tobacco.  Abuse alcohol or illegal drugs.  Have taken medicines that can damage the heart, such as chemotherapy drugs.  Have diabetes. ? High blood sugar (glucose) is associated with high fat (lipid) levels in the blood. ? Diabetes can also damage tiny blood vessels that carry nutrients to the heart muscle.  Have abnormal heart rhythms.  Have thyroid problems.  Have low blood counts (anemia). What are the signs or symptoms? Symptoms of this condition include:  Shortness of breath with activity, such as when climbing stairs.  Persistent cough.  Swelling of the feet, ankles, legs, or abdomen.  Unexplained weight gain.  Difficulty breathing when lying flat (orthopnea).  Waking from sleep because of the need to sit up and get more air.  Rapid heartbeat.  Fatigue and loss of energy.  Feeling light-headed, dizzy, or close to fainting.  Loss of appetite.  Nausea.  Increased urination during the night (nocturia).  Confusion. How is this diagnosed? This condition is diagnosed based on:  Medical history, symptoms, and a physical exam.  Diagnostic tests, which may include: ? Echocardiogram. ? Electrocardiogram (ECG). ? Chest X-ray. ? Blood tests. ? Exercise stress test. ? Radionuclide scans. ? Cardiac catheterization and angiogram. How  is this treated? Treatment for this condition is aimed at managing the symptoms of heart failure. Medicines, behavioral changes, or other treatments may be necessary to treat heart failure. Medicines These may include:  Angiotensin-converting enzyme (ACE) inhibitors. This type of medicine blocks the effects of a blood protein called angiotensin-converting enzyme. ACE inhibitors relax (dilate) the blood vessels and help to lower blood pressure.  Angiotensin receptor blockers (ARBs). This type of medicine blocks the actions of a blood protein called angiotensin. ARBs dilate the blood vessels and help to lower blood  pressure.  Water pills (diuretics). Diuretics cause the kidneys to remove salt and water from the blood. The extra fluid is removed through urination, leaving a lower volume of blood that the heart has to pump.  Beta blockers. These improve heart muscle strength and they prevent the heart from beating too quickly.  Digoxin. This increases the force of the heartbeat. Healthy behavior changes These may include:  Reaching and maintaining a healthy weight.  Stopping smoking or chewing tobacco.  Eating heart-healthy foods.  Limiting or avoiding alcohol.  Stopping use of street drugs (illegal drugs).  Physical activity. Other treatments These may include:  Surgery to open blocked coronary arteries or repair damaged heart valves.  Placement of a biventricular pacemaker to improve heart muscle function (cardiac resynchronization therapy). This device paces both the right ventricle and left ventricle.  Placement of a device to treat serious abnormal heart rhythms (implantable cardioverter defibrillator, or ICD).  Placement of a device to improve the pumping ability of the heart (left ventricular assist device, or LVAD).  Heart transplant. This can cure heart failure, and it is considered for certain patients who do not improve with other therapies. Follow these instructions at home: Medicines  Take over-the-counter and prescription medicines only as told by your health care provider. Medicines are important in reducing the workload of your heart, slowing the progression of heart failure, and improving your symptoms. ? Do not stop taking your medicine unless your health care provider told you to do that. ? Do not skip any dose of medicine. ? Refill your prescriptions before you run out of medicine. You need your medicines every day. Eating and drinking   Eat heart-healthy foods. Talk with a dietitian to make an eating plan that is right for you. ? Choose foods that contain no trans  fat and are low in saturated fat and cholesterol. Healthy choices include fresh or frozen fruits and vegetables, fish, lean meats, legumes, fat-free or low-fat dairy products, and whole-grain or high-fiber foods. ? Limit salt (sodium) if directed by your health care provider. Sodium restriction may reduce symptoms of heart failure. Ask a dietitian to recommend heart-healthy seasonings. ? Use healthy cooking methods instead of frying. Healthy methods include roasting, grilling, broiling, baking, poaching, steaming, and stir-frying.  Limit your fluid intake if directed by your health care provider. Fluid restriction may reduce symptoms of heart failure. Lifestyle   Stop smoking or using chewing tobacco. Nicotine and tobacco can damage your heart and your blood vessels. Do not use nicotine gum or patches before talking to your health care provider.  Limit alcohol intake to no more than 1 drink per day for non-pregnant women and 2 drinks per day for men. One drink equals 12 oz of beer, 5 oz of wine, or 1 oz of hard liquor. ? Drinking more than that is harmful to your heart. Tell your health care provider if you drink alcohol several times a week. ? Talk with  your health care provider about whether any level of alcohol use is safe for you. ? If your heart has already been damaged by alcohol or you have severe heart failure, drinking alcohol should be stopped completely.  Stop use of illegal drugs.  Lose weight if directed by your health care provider. Weight loss may reduce symptoms of heart failure.  Do moderate physical activity if directed by your health care provider. People who are elderly and people with severe heart failure should consult with a health care provider for physical activity recommendations. Monitor important information   Weigh yourself every day. Keeping track of your weight daily helps you to notice excess fluid sooner. ? Weigh yourself every morning after you urinate and  before you eat breakfast. ? Wear the same amount of clothing each time you weigh yourself. ? Record your daily weight. Provide your health care provider with your weight record.  Monitor and record your blood pressure as told by your health care provider.  Check your pulse as told by your health care provider. Dealing with extreme temperatures  If the weather is extremely hot: ? Avoid vigorous physical activity. ? Use air conditioning or fans or seek a cooler location. ? Avoid caffeine and alcohol. ? Wear loose-fitting, lightweight, and light-colored clothing.  If the weather is extremely cold: ? Avoid vigorous physical activity. ? Layer your clothes. ? Wear mittens or gloves, a hat, and a scarf when you go outside. ? Avoid alcohol. General instructions  Manage other health conditions such as hypertension, diabetes, thyroid disease, or abnormal heart rhythms as told by your health care provider.  Learn to manage stress. If you need help to do this, ask your health care provider.  Plan rest periods when fatigued.  Get ongoing education and support as needed.  Participate in or seek rehabilitation as needed to maintain or improve independence and quality of life.  Stay up to date with immunizations. Keeping current on pneumococcal and influenza immunizations is especially important to prevent respiratory infections.  Keep all follow-up visits as told by your health care provider. This is important. Contact a health care provider if:  You have a rapid weight gain.  You have increasing shortness of breath that is unusual for you.  You are unable to participate in your usual physical activities.  You tire easily.  You cough more than normal, especially with physical activity.  You have any swelling or more swelling in areas such as your hands, feet, ankles, or abdomen.  You are unable to sleep because it is hard to breathe.  You feel like your heart is beating quickly  (palpitations).  You become dizzy or light-headed when you stand up. Get help right away if:  You have difficulty breathing.  You notice or your family notices a change in your awareness, such as having trouble staying awake or having difficulty with concentration.  You have pain or discomfort in your chest.  You have an episode of fainting (syncope). This information is not intended to replace advice given to you by your health care provider. Make sure you discuss any questions you have with your health care provider. Document Released: 07/10/2005 Document Revised: 06/08/2017 Document Reviewed: 02/02/2016 Elsevier Interactive Patient Education  [employer].

## 2018-07-15 NOTE — Progress Notes (Signed)
Physical Therapy Treatment Patient Details Name: Nathaniel Kirk MRN: [id number] DOB: 1946/05/24 Today's Date: 07/15/2018    History of Present Illness Pt is a 72 y.o. male admitted 07/11/18 with worsening SOB, weakness and cough; found to be hypotensive. VQ scan low probability for PE. CXR showed chronic emphysematous and bronchitic changes. 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   PT Comments    Pt progressing well with mobility. Ambulatory at [place], able to manage IV pole and foley bag independently; SpO2 99% on RA, DOE 2/4 with short ambulation distance. Pt motivated to return home today. Will have necessary support from family. Pt has no further questions or concerns. Continue to recommend HHPT services. If to remain admitted, will follow acutely.   Follow Up Recommendations  Home health PT;Supervision for mobility/OOB     Equipment Recommendations  Wheelchair (measurements PT);Wheelchair cushion (measurements PT);3in1 (PT)    Recommendations for Other Services       Precautions / Restrictions Precautions Precautions: Fall;Other (comment) Precaution Comments: urostomy R LQ, chest port Restrictions Weight Bearing Restrictions: No    Mobility  Bed Mobility Overal bed mobility: Modified Independent             General bed mobility comments: HOB elevated; does well managing foley bag  Transfers Overall transfer level: Independent Equipment used: None Transfers: Sit to/from [employer] transfer comment: Indep standing from bed and toilet to IV pole; does well managing foley  Ambulation/Gait Ambulation/Gait assistance: Supervision Gait Distance (Feet): 30 Feet Assistive device: IV Pole Gait Pattern/deviations: Step-through pattern;Decreased stride length Gait velocity: Decreased Gait velocity interpretation: 1.31 - 2.62 ft/sec, indicative of limited community  ambulator General Gait Details: Ambulatory in room pushing IV pole and managing foley bag; supervision for safety, no physical assist required   Stairs             Wheelchair Mobility    Modified Rankin (Stroke Patients Only)       Balance Overall balance assessment: Needs assistance Sitting-balance support: No upper extremity supported;Feet supported Sitting balance-Leahy Scale: Good     Standing balance support: No upper extremity supported;During functional activity Standing balance-Leahy Scale: Good Standing balance comment: Supervision for safety                            Cognition Arousal/Alertness: Awake/alert Behavior During Therapy: WFL for tasks assessed/performed Overall Cognitive Status: Within Functional Limits for tasks assessed                                        Exercises      General Comments General comments (skin integrity, edema, etc.): SpO2 99% on RA; DOE 2/4 ambulating to/from bathroom      Pertinent Vitals/Pain Pain Assessment: No/denies pain    Home Living                      Prior Function            PT Goals (current goals can now be found in the care plan section) Acute Rehab PT Goals Patient Stated Goal: "Go home for Christmas" PT Goal Formulation: With patient Time For Goal Achievement: 07/27/18 Potential to Achieve Goals: Good Progress towards PT goals: Progressing toward goals    Frequency  Min 3X/week      PT Plan Current plan remains appropriate    Co-evaluation              AM-PAC PT "6 Clicks" Mobility   Outcome Measure  Help needed turning from your back to your side while in a flat bed without using bedrails?: None Help needed moving from lying on your back to sitting on the side of a flat bed without using bedrails?: None Help needed moving to and from a bed to a chair (including a wheelchair)?: None Help needed standing up from a chair using your arms  (e.g., wheelchair or bedside chair)?: None Help needed to walk in hospital room?: A Little Help needed climbing 3-5 steps with a railing? : A Lot 6 Click Score: 21    End of Session   Activity Tolerance: Patient tolerated treatment well;Patient limited by fatigue Patient left: in chair;with call bell/phone within reach;Other (comment)(with OT present) Nurse Communication: Mobility status PT Visit Diagnosis: Other abnormalities of gait and mobility (R26.89);Pain     Time: 7414-2395 PT Time Calculation (min) (ACUTE ONLY): 14 min  Charges:  $Gait Training: 8-22 mins                    Mabeline Caras, PT, DPT Acute Rehabilitation Services  Pager [phone] [place] 07/15/2018, 10:43 AM

## 2018-07-15 NOTE — Discharge Summary (Signed)
DISCHARGE SUMMARY  Nathaniel Kirk  MR#: [id number]  DOB:08-Apr-1946  Date of Admission: 07/10/2018 Date of Discharge: 07/15/2018  Attending Physician:Nathaniel Kirk Nathaniel Duos, MD  Patient's VZD:GLOVFIE, Nathaniel Millin, MD  Consults:  Cardiology - Dr. Einar Gip  Disposition: D/C home   Follow-up Appts: Follow-up Information    Miquel Dunn, NP Follow up on 07/18/2018.   Specialty:  Cardiology Why:  3:30 PM Contact information: [place] [address] [phone]        [place], [place] Follow up.   Specialty:  [place] Why:  home health services arranged Contact information: [place] [address] [phone]        Willey Blade, MD Follow up in 1 week(s).   Specialty:  Internal Medicine Contact information: [address] [place] [address] [phone]           Tests Needing Follow-up: -f/u TTE will be needed in 2-3 months  -assess for clearance of UTI -assess multifactorial dyspnea -f/u renal fxn and electrolytes -consider referral for PFTs   Discharge Diagnoses: Klebsiella and Enterococcus faecalis UTI (ileal conduit) Shortness of breath Newly diagnoses systolic CHF Hypotension COPD Bladder cancer Hyponatremia AKIonCKDIII Hyperkalemia Hyperglycemia Recent bilateral PE status post IVC  Initial presentation: 72 year old male with a history of bilateral PE in September 2019 status post IVC filter and muscle invasive bladder cancer followed at [facility] s/p chemo and radical cystectomy w/ urostomy (complicated by postoperative bleeding / abdominal hematoma > off anticoagulation) who presented w/ fatigue and shortness of breath.   On arrival his BP was 80/50 w/ heart rate 110. Blood work showed creat 2.8, Na 128, and K+ 6.1. His UA (urostomy) was abnormal w/ WBC more than 50, few bacteria, large leukocytes. Chest x-ray showed chronic emphysematous and bronchitic  changes, no acute cardiopulmonary finding. Cortisol 32, TSH 2.1.   Hospital Course:  Klebsiella and Enterococcus faecalis UTI (ileal conduit) Kleb is not ESBL per culture testing - changed to merem 12/20 while awaiting sensitivities - culture then revealed a second organsim, Enterococcus faecalis - transitioned to oral augmentin 12/22 - plan to complete an extended course of tx due to recent hx of recurring UTIs, w/ 10 more days of tx post d/c    Shortness of breath VQ scan low probably for PE - RVP negative - appears to have been related to hypotension, DH, COPD, and also likely poor CO in setting of subacute systolic CHF - see separate issues discussed below   Newly diagnosed systolic CHF TTE 55/73 noted EF 30-35% - TTE at [facility] Sept 2019 noted EF 55% - perhaps due to chemotx - pattern not suggestive of CAD on TTE - will need f/u TTE in 2-3 months - avoid ACE/ARB due to AKI - no sx to suggest CAD - has been seen by Cardiology - stop IVF now - outpt Cards f/u has been arranged   Hypotension cortisol level normal - BP improved w/ volume resuscitation   COPD continue bronchodilators - well compensated at time of d/c - suspect this is quite severe (long hx of 2PPD smoking) - transitioned to maintenance inhalers prior to d/c - would benefit from formal PFTs and Pulm eval as outpt once cardiac issues have stabilized   Bladder cancer followed by [facility] - urostomy in place (ileal conduit) - s/p bladder resection and chemotx (Cisplatin)  Hyponatremia Due to hypovolemia - resolved w/ volume resuscitation  AKIonCKDIII creatinine 2.25 May 2018, and prior to that 1.5-1.8 -  improved w/ hydration to 1.46 at time of d/c    Hyperkalemia Due to above - resolved   Hyperglycemia A1c is 5.8, therefore NOT c/w DM   Recent bilateral PE status post IVC anticoagulation discontinued due to significant anemia - pt refuses SQ abdom injections (had signif abdom wall hematoma) - ambulated asap -  utilized SCDs    Allergies as of 07/15/2018   No Known Allergies     Medication List    TAKE these medications   acetaminophen 325 MG tablet Commonly known as:  TYLENOL Take 650 mg by mouth every 6 (six) hours as needed.   amoxicillin-clavulanate 875-125 MG tablet Commonly known as:  AUGMENTIN Take 1 tablet by mouth every 12 (twelve) hours.   ferrous sulfate 325 (65 FE) MG tablet Take 325 mg by mouth daily with breakfast.   fluticasone furoate-vilanterol 200-25 MCG/INH Aepb Commonly known as:  BREO ELLIPTA Inhale 1 puff into the lungs daily. Start taking on:  July 16, 2018   [facility] Caps Take by mouth daily.   Oxycodone HCl 10 MG Tabs Take 10 mg by mouth every 6 (six) hours.   polyethylene glycol packet Commonly known as:  MIRALAX / GLYCOLAX Take 1 Container by mouth once.   SENOKOT 8.6 MG tablet Generic drug:  senna Take 1 tablet by mouth daily.   traMADol 50 MG tablet Commonly known as:  ULTRAM Take 50 mg by mouth every 6 (six) hours as needed for moderate pain.   umeclidinium bromide 62.5 MCG/INH Aepb Commonly known as:  INCRUSE ELLIPTA Inhale 1 puff into the lungs daily. Start taking on:  July 16, 2018   ZOFRAN 4 MG tablet Generic drug:  ondansetron Take 2 tablets by mouth 2 (two) times daily.       Day of Discharge BP (!) 109/56 (BP Location: Right Arm)   Pulse 66   Temp 98 F (36.7 C) (Oral)   Resp 18   Ht 5\' 9"  (1.753 m)   Wt 100.2 kg   SpO2 95%   BMI 32.64 kg/m   Physical Exam: General: No acute respiratory distress Lungs: Clear to auscultation bilaterally without wheezes or crackles Cardiovascular: Regular rate and rhythm without murmur gallop or rub normal S1 and S2 Abdomen: Nontender, nondistended, soft, bowel sounds positive, no rebound, no ascites, no appreciable mass Extremities: No significant cyanosis, clubbing, or edema bilateral lower extremities  Basic Metabolic Panel: Recent Labs  Lab 07/11/18 0205  07/11/18 0730 07/12/18 0428 07/13/18 0421 07/14/18 0416 07/15/18 0352  NA 130*  --  132* 134* 134* 137  K 5.4*  --  4.8 4.9 4.9 5.3*  CL 99  --  101 104 106 108  CO2 18*  --  21* 21* 21* 21*  GLUCOSE 149*  --  125* 87 86 94  BUN 57*  --  55* 41* 33* 27*  CREATININE 2.83*  --  2.58* 1.93* 1.68* 1.46*  CALCIUM 9.2  --  8.9 8.8* 8.8* 8.7*  MG  --  2.1  --  2.1 2.0 2.0  PHOS  --  4.1  --   --   --   --     Liver Function Tests: Recent Labs  Lab 07/10/18 2204 07/11/18 0205 07/13/18 0421 07/14/18 0416  AST 30 27 21 20   ALT 34 31 24 23   ALKPHOS 135* 121 99 102  BILITOT 0.6 0.5 0.5 0.6  PROT 7.1 6.6 6.0* 5.7*  ALBUMIN 3.1* 3.1* 2.6* 2.5*    CBC: Recent Labs  Lab 07/10/18 2204 07/11/18 0205 07/12/18 0428 07/13/18 0421 07/14/18 0416 07/15/18 0352  WBC 20.2* 19.2* 12.8* 12.4* 11.2* 12.9*  NEUTROABS 18.0*  --   --   --   --   --   HGB 11.9* 11.0* 9.7* 9.5* 9.5* 9.7*  HCT 36.8* 35.1* 31.4* 30.6* 31.0* 32.0*  MCV 94.4 93.9 94.9 94.4 95.7 95.5  PLT 184 160 142* 149* 165 172    Cardiac Enzymes: Recent Labs  Lab 07/11/18 0205 07/11/18 0730 07/11/18 1349  TROPONINI <0.03 <0.03 <0.03   BNP (last 3 results) Recent Labs    07/11/18 0059  BNP 169.3*    CBG: Recent Labs  Lab 07/12/18 0017 07/12/18 0441 07/12/18 0823 07/12/18 1141 07/12/18 1816  GLUCAP 90 116* 89 108* 94    Recent Results (from the past 240 hour(s))  Culture, blood (Routine X 2) w Reflex to ID Panel     Status: None (Preliminary result)   Collection Time: 07/11/18  1:00 AM  Result Value Ref Range Status   Specimen Description BLOOD RIGHT FOREARM  Final   Special Requests   Final    BOTTLES DRAWN AEROBIC AND ANAEROBIC Blood Culture results may not be optimal due to an inadequate volume of blood received in culture bottles   Culture   Final    NO GROWTH 3 DAYS Performed at [facility] [address] [place], [place] [address]    Report Status PENDING  Incomplete  Culture, blood  (Routine X 2) w Reflex to ID Panel     Status: None (Preliminary result)   Collection Time: 07/11/18  1:05 AM  Result Value Ref Range Status   Specimen Description BLOOD RIGHT HAND  Final   Special Requests   Final    BOTTLES DRAWN AEROBIC AND ANAEROBIC Blood Culture results may not be optimal due to an inadequate volume of blood received in culture bottles   Culture   Final    NO GROWTH 3 DAYS Performed at [facility], [place] [address] [place], [place] [address]    Report Status PENDING  Incomplete  Respiratory Panel by PCR     Status: None   Collection Time: 07/11/18  2:20 AM  Result Value Ref Range Status   Adenovirus NOT DETECTED NOT DETECTED Final   Coronavirus 229E NOT DETECTED NOT DETECTED Final   Coronavirus HKU1 NOT DETECTED NOT DETECTED Final   Coronavirus NL63 NOT DETECTED NOT DETECTED Final   Coronavirus OC43 NOT DETECTED NOT DETECTED Final   Metapneumovirus NOT DETECTED NOT DETECTED Final   Rhinovirus / Enterovirus NOT DETECTED NOT DETECTED Final   Influenza A NOT DETECTED NOT DETECTED Final   Influenza B NOT DETECTED NOT DETECTED Final   Parainfluenza Virus 1 NOT DETECTED NOT DETECTED Final   Parainfluenza Virus 2 NOT DETECTED NOT DETECTED Final   Parainfluenza Virus 3 NOT DETECTED NOT DETECTED Final   Parainfluenza Virus 4 NOT DETECTED NOT DETECTED Final   Respiratory Syncytial Virus NOT DETECTED NOT DETECTED Final   Bordetella pertussis NOT DETECTED NOT DETECTED Final   Chlamydophila pneumoniae NOT DETECTED NOT DETECTED Final   Mycoplasma pneumoniae NOT DETECTED NOT DETECTED Final    Comment: Performed at [facility], [place] [address], [place], [place] [address]  Urine Culture     Status: Abnormal   Collection Time: 07/11/18  7:30 AM  Result Value Ref Range Status   Specimen Description URINE, CLEAN CATCH  Final   Special Requests   Final  NONE Performed at [facility], [place] [address] [place], [place] [address]    Culture (A)  Final     >=100,000 COLONIES/mL KLEBSIELLA PNEUMONIAE >=100,000 COLONIES/mL ENTEROCOCCUS FAECALIS    Report Status 07/14/2018 FINAL  Final   Organism ID, Bacteria KLEBSIELLA PNEUMONIAE (A)  Final   Organism ID, Bacteria ENTEROCOCCUS FAECALIS (A)  Final      Susceptibility   Enterococcus faecalis - MIC*    AMPICILLIN <=2 SENSITIVE Sensitive     LEVOFLOXACIN 1 SENSITIVE Sensitive     NITROFURANTOIN <=16 SENSITIVE Sensitive     VANCOMYCIN <=0.5 SENSITIVE Sensitive     * >=100,000 COLONIES/mL ENTEROCOCCUS FAECALIS   Klebsiella pneumoniae - MIC*    AMPICILLIN >=32 RESISTANT Resistant     CEFAZOLIN <=4 SENSITIVE Sensitive     CEFTRIAXONE <=1 SENSITIVE Sensitive     CIPROFLOXACIN <=0.25 SENSITIVE Sensitive     GENTAMICIN <=1 SENSITIVE Sensitive     IMIPENEM <=0.25 SENSITIVE Sensitive     NITROFURANTOIN 64 INTERMEDIATE Intermediate     TRIMETH/SULFA <=20 SENSITIVE Sensitive     AMPICILLIN/SULBACTAM 4 SENSITIVE Sensitive     PIP/TAZO <=4 SENSITIVE Sensitive     Extended ESBL NEGATIVE Sensitive     * >=100,000 COLONIES/mL KLEBSIELLA PNEUMONIAE  MRSA PCR Screening     Status: None   Collection Time: 07/11/18  4:23 PM  Result Value Ref Range Status   MRSA by PCR NEGATIVE NEGATIVE Final    Comment:        The GeneXpert MRSA Assay (FDA approved for NASAL specimens only), is one component of a comprehensive MRSA colonization surveillance program. It is not intended to diagnose MRSA infection nor to guide or monitor treatment for MRSA infections. Performed at [facility], [place] [address], [place], [place] [address]       Time spent in discharge (includes decision making & examination of pt): 35 minutes  07/15/2018, 8:53 AM   Cherene Altes, MD [facility] Office  [phone] Pager [phone]  On-Call/Text Page:      [email or web address]      password [facility]

## 2018-07-16 LAB — CULTURE, BLOOD (ROUTINE X 2)
Culture: NO GROWTH
Culture: NO GROWTH

## 2018-07-24 DEATH — deceased

## 2018-08-13 ENCOUNTER — Inpatient Hospital Stay: Payer: Medicare Other | Attending: Oncology

## 2018-08-13 ENCOUNTER — Inpatient Hospital Stay: Payer: Medicare Other

## 2018-10-04 ENCOUNTER — Other Ambulatory Visit: Payer: Self-pay

## 2018-10-04 DIAGNOSIS — I714 Abdominal aortic aneurysm, without rupture, unspecified: Secondary | ICD-10-CM

## 2018-10-14 ENCOUNTER — Inpatient Hospital Stay: Payer: Self-pay | Attending: Oncology

## 2018-10-14 ENCOUNTER — Inpatient Hospital Stay: Payer: Self-pay

## 2018-10-28 ENCOUNTER — Other Ambulatory Visit: Payer: Medicare Other

## 2018-10-29 ENCOUNTER — Ambulatory Visit: Payer: Medicare Other | Admitting: Vascular Surgery

## 2018-12-09 ENCOUNTER — Encounter: Payer: Self-pay | Admitting: Oncology

## 2018-12-13 ENCOUNTER — Other Ambulatory Visit: Payer: Medicare Other

## 2018-12-13 ENCOUNTER — Ambulatory Visit: Payer: Medicare Other | Admitting: Oncology

## 2019-05-31 IMAGING — NM NM PULMONARY VENT & PERF
13 series · 13 of 13 positions shown · non-contrast
Comparison: Chest radiograph 07/10/2018

CLINICAL DATA: Two day history of shortness of breath suddenly
worse today. Cough. Quit smoking 3 months ago. Cancer patient.

EXAM:
NUCLEAR MEDICINE VENTILATION - PERFUSION LUNG SCAN
TECHNIQUE: Ventilation images were obtained in multiple projections using
inhaled aerosol Ec-QQm DTPA. Perfusion images were obtained in
multiple projections after intravenous injection of Ec-QQm MAA.
RADIOPHARMACEUTICALS:  31 mCi of Ec-QQm DTPA aerosol inhalation and
4.15 mCi TcDDm MAA IV

[Series 1: ant/post vent · 4.14mm/px · 1 of 1 slices shown (1 of 2)]
[im 1/1]
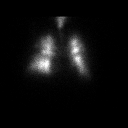

[Series 1: ant/post vent · 4.14mm/px · 1 of 1 slices shown (2 of 2)]
[im 1/1]
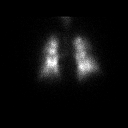

[Series 2: lao/rpo vent · 4.14mm/px · 1 of 1 slices shown]
[im 1/1]
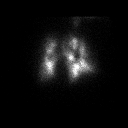

[Series 3: lpo/rao vent · 4.14mm/px · 1 of 1 slices shown (1 of 2)]
[im 1/1]
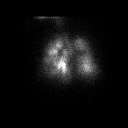

[Series 3: lpo/rao vent · 4.14mm/px · 1 of 1 slices shown (2 of 2)]
[im 1/1]
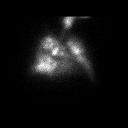

[Series 4: lt lat/rt lat vent · 4.14mm/px · 1 of 1 slices shown (1 of 2)]
[im 1/1]
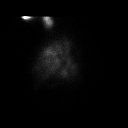

[Series 4: lt lat/rt lat vent · 4.14mm/px · 1 of 1 slices shown (2 of 2)]
[im 1/1]
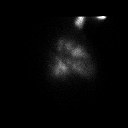

[Series 5: lt lat/rt lat perf · 4.14mm/px · 1 of 1 slices shown (1 of 2)]
[im 1/1]
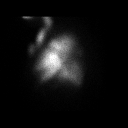

[Series 5: lt lat/rt lat perf · 4.14mm/px · 1 of 1 slices shown (2 of 2)]
[im 1/1]
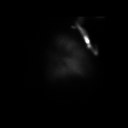

[Series 6: lpo/rao perf · 4.14mm/px · 1 of 1 slices shown (1 of 2)]
[im 1/1]
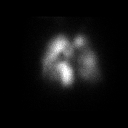

[Series 6: lpo/rao perf · 4.14mm/px · 1 of 1 slices shown (2 of 2)]
[im 1/1]
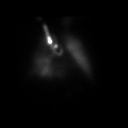

[Series 7: ant/post perf · 4.14mm/px · 1 of 1 slices shown (1 of 2)]
[im 1/1]
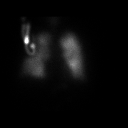

[Series 7: ant/post perf · 4.14mm/px · 1 of 1 slices shown (2 of 2)]
[im 1/1]
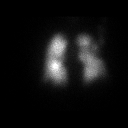

[13 of 13 positions shown; findings below may reference images not displayed]

FINDINGS: Ventilation: Heterogeneous ventilation with multiple small defects
demonstrated in both lungs.

Perfusion: Several small perfusion defects are demonstrated, either
matching or smaller than ventilation abnormalities. No definite
mismatched segmental perfusion defects identified.
IMPRESSION: Low probability of pulmonary embolus.
